# Patient Record
Sex: Female | Born: 1965 | Race: Black or African American | Hispanic: No | Marital: Single | Smoking: Current every day smoker
Health system: Southern US, Community
[De-identification: ages and names within clinical notes are randomized; demographics above are authoritative.]

## PROBLEM LIST (undated history)

## (undated) DIAGNOSIS — E119 Type 2 diabetes mellitus without complications: Secondary | ICD-10-CM

## (undated) DIAGNOSIS — IMO0002 Reserved for concepts with insufficient information to code with codable children: Secondary | ICD-10-CM

## (undated) DIAGNOSIS — D649 Anemia, unspecified: Secondary | ICD-10-CM

## (undated) DIAGNOSIS — Z5189 Encounter for other specified aftercare: Secondary | ICD-10-CM

## (undated) DIAGNOSIS — T7840XA Allergy, unspecified, initial encounter: Secondary | ICD-10-CM

## (undated) DIAGNOSIS — E079 Disorder of thyroid, unspecified: Secondary | ICD-10-CM

## (undated) DIAGNOSIS — L0292 Furuncle, unspecified: Secondary | ICD-10-CM

## (undated) DIAGNOSIS — F1910 Other psychoactive substance abuse, uncomplicated: Secondary | ICD-10-CM

## (undated) DIAGNOSIS — N83209 Unspecified ovarian cyst, unspecified side: Secondary | ICD-10-CM

## (undated) DIAGNOSIS — E049 Nontoxic goiter, unspecified: Secondary | ICD-10-CM

## (undated) DIAGNOSIS — R87619 Unspecified abnormal cytological findings in specimens from cervix uteri: Secondary | ICD-10-CM

## (undated) DIAGNOSIS — N390 Urinary tract infection, site not specified: Secondary | ICD-10-CM

## (undated) DIAGNOSIS — M1990 Unspecified osteoarthritis, unspecified site: Secondary | ICD-10-CM

## (undated) DIAGNOSIS — F419 Anxiety disorder, unspecified: Secondary | ICD-10-CM

## (undated) DIAGNOSIS — N739 Female pelvic inflammatory disease, unspecified: Secondary | ICD-10-CM

## (undated) DIAGNOSIS — I10 Essential (primary) hypertension: Secondary | ICD-10-CM

## (undated) DIAGNOSIS — N200 Calculus of kidney: Secondary | ICD-10-CM

## (undated) DIAGNOSIS — D219 Benign neoplasm of connective and other soft tissue, unspecified: Secondary | ICD-10-CM

## (undated) DIAGNOSIS — A4902 Methicillin resistant Staphylococcus aureus infection, unspecified site: Secondary | ICD-10-CM

## (undated) HISTORY — DX: Other psychoactive substance abuse, uncomplicated: F19.10

## (undated) HISTORY — DX: Anemia, unspecified: D64.9

## (undated) HISTORY — DX: Unspecified osteoarthritis, unspecified site: M19.90

## (undated) HISTORY — DX: Disorder of thyroid, unspecified: E07.9

## (undated) HISTORY — DX: Allergy, unspecified, initial encounter: T78.40XA

## (undated) HISTORY — DX: Essential (primary) hypertension: I10

## (undated) HISTORY — DX: Anxiety disorder, unspecified: F41.9

## (undated) HISTORY — PX: INDUCED ABORTION: SHX677

## (undated) HISTORY — PX: DILATION AND CURETTAGE OF UTERUS: SHX78

## (undated) HISTORY — PX: SALIVARY STONE REMOVAL: SHX5213

## (undated) HISTORY — PX: KNEE ARTHROSCOPY: SUR90

## (undated) HISTORY — DX: Encounter for other specified aftercare: Z51.89

## (undated) HISTORY — PX: COLPOSCOPY: SHX161

## (undated) HISTORY — PX: ABDOMINAL HYSTERECTOMY: SHX81

---

## 2008-09-12 ENCOUNTER — Emergency Department (HOSPITAL_COMMUNITY): Admission: EM | Admit: 2008-09-12 | Discharge: 2008-09-12 | Payer: Self-pay | Admitting: Family Medicine

## 2009-03-27 ENCOUNTER — Emergency Department (HOSPITAL_COMMUNITY): Admission: EM | Admit: 2009-03-27 | Discharge: 2009-03-27 | Payer: Self-pay | Admitting: Family Medicine

## 2009-05-06 ENCOUNTER — Emergency Department (HOSPITAL_COMMUNITY): Admission: EM | Admit: 2009-05-06 | Discharge: 2009-05-06 | Payer: Self-pay | Admitting: Family Medicine

## 2010-03-31 LAB — POCT I-STAT, CHEM 8
BUN: 6 mg/dL (ref 6–23)
Calcium, Ion: 1.18 mmol/L (ref 1.12–1.32)
Chloride: 106 mEq/L (ref 96–112)
Creatinine, Ser: 0.5 mg/dL (ref 0.4–1.2)
Glucose, Bld: 93 mg/dL (ref 70–99)
TCO2: 25 mmol/L (ref 0–100)

## 2010-04-06 LAB — GC/CHLAMYDIA PROBE AMP, GENITAL: GC Probe Amp, Genital: NEGATIVE

## 2010-04-06 LAB — WET PREP, GENITAL
Clue Cells Wet Prep HPF POC: NONE SEEN
Trich, Wet Prep: NONE SEEN

## 2010-04-17 LAB — WET PREP, GENITAL: Yeast Wet Prep HPF POC: NONE SEEN

## 2010-04-17 LAB — POCT URINALYSIS DIP (DEVICE)
Bilirubin Urine: NEGATIVE
Glucose, UA: NEGATIVE mg/dL
Ketones, ur: NEGATIVE mg/dL
Nitrite: NEGATIVE

## 2010-04-17 LAB — GC/CHLAMYDIA PROBE AMP, GENITAL: GC Probe Amp, Genital: NEGATIVE

## 2010-11-23 ENCOUNTER — Encounter (HOSPITAL_COMMUNITY): Payer: Self-pay | Admitting: *Deleted

## 2010-11-23 ENCOUNTER — Inpatient Hospital Stay (HOSPITAL_COMMUNITY): Payer: Self-pay

## 2010-11-23 ENCOUNTER — Inpatient Hospital Stay (HOSPITAL_COMMUNITY)
Admission: AD | Admit: 2010-11-23 | Discharge: 2010-11-23 | Disposition: A | Discharge status: Home / Self Care | Payer: Self-pay | Source: Ambulatory Visit | Attending: Obstetrics & Gynecology | Admitting: Obstetrics & Gynecology

## 2010-11-23 DIAGNOSIS — N938 Other specified abnormal uterine and vaginal bleeding: Secondary | ICD-10-CM | POA: Insufficient documentation

## 2010-11-23 DIAGNOSIS — D259 Leiomyoma of uterus, unspecified: Secondary | ICD-10-CM

## 2010-11-23 DIAGNOSIS — D649 Anemia, unspecified: Secondary | ICD-10-CM

## 2010-11-23 DIAGNOSIS — R109 Unspecified abdominal pain: Secondary | ICD-10-CM | POA: Insufficient documentation

## 2010-11-23 DIAGNOSIS — N949 Unspecified condition associated with female genital organs and menstrual cycle: Secondary | ICD-10-CM | POA: Insufficient documentation

## 2010-11-23 LAB — CBC
MCH: 30.2 pg (ref 26.0–34.0)
Platelets: 184 10*3/uL (ref 150–400)
RBC: 3.08 MIL/uL — ABNORMAL LOW (ref 3.87–5.11)
WBC: 5.6 10*3/uL (ref 4.0–10.5)

## 2010-11-23 LAB — WET PREP, GENITAL: Trich, Wet Prep: NONE SEEN

## 2010-11-23 MED ORDER — KETOROLAC TROMETHAMINE 60 MG/2ML IM SOLN
60.0000 mg | Freq: Once | INTRAMUSCULAR | Status: AC
  Administered 2010-11-23: 60 mg via INTRAMUSCULAR
  Filled 2010-11-23: qty 2

## 2010-11-23 MED ORDER — HYDROCODONE-ACETAMINOPHEN 5-325 MG PO TABS: 1.0000 | ORAL_TABLET | ORAL | Status: AC | PRN

## 2010-11-23 NOTE — ED Provider Notes (Signed)
History     Chief Complaint  Patient presents with  . Abdominal Pain   HPI 45 y.o. [id number] with c/o pain and vaginal bleeding. Has ongoing low abdominal pain. LMP started 5 days ago, states for 2 days she was wearing both a pad and tampon and changing q 30 minutes. States she "passed out" x 3 yesterday. H/O fibroids, ovarian cysts, heavy periods and anemia. Has been admitted in past for blood transfusion. Has Mirena in place to help control bleeding x 3 years - states it hasn't helped at all.      No past medical history on file.  No past surgical history on file.  No family history on file.  History  Substance Use Topics  . Smoking status: Not on file  . Smokeless tobacco: Not on file  . Alcohol Use: Not on file    Allergies:  Allergies  Allergen Reactions  . Shellfish Allergy Anaphylaxis    Prescriptions prior to admission  Medication Sig Dispense Refill  . aspirin 81 MG tablet Take 81 mg by mouth daily.        . cyclobenzaprine (FLEXERIL) 10 MG tablet Take 10 mg by mouth 3 (three) times daily as needed.        . Ferrous Sulfate (IRON) 325 (65 FE) MG TABS Take 1 capsule by mouth daily as needed.        . hydrochlorothiazide (HYDRODIURIL) 25 MG tablet Take 25 mg by mouth daily.          Review of Systems  Constitutional: Negative.   Respiratory: Negative.   Cardiovascular: Negative.   Gastrointestinal: Positive for abdominal pain. Negative for nausea, vomiting, diarrhea and constipation.  Genitourinary: Negative for dysuria, urgency, frequency, hematuria and flank pain.       Negative for vaginal bleeding, vaginal discharge, dyspareunia  Musculoskeletal: Negative.   Neurological: Negative.   Psychiatric/Behavioral: Negative.    Physical Exam   Blood pressure 141/88, pulse 100, temperature 98.6 F (37 C), resp. rate 18, height 5\' 5"  (1.651 m), weight 100.245 kg (221 lb), last menstrual period 11/18/2010, SpO2 99.00%.  Physical Exam  Constitutional: She is  oriented to person, place, and time. She appears well-developed and well-nourished. No distress.  HENT:  Head: Normocephalic and atraumatic.  Cardiovascular: Normal rate, regular rhythm and normal heart sounds.   Respiratory: Effort normal and breath sounds normal. No respiratory distress.  GI: Soft. Bowel sounds are normal. She exhibits no distension and no mass. There is no tenderness. There is no rebound and no guarding.  Genitourinary: There is no rash or lesion on the right labia. There is no rash or lesion on the left labia. Uterus is enlarged and tender. Uterus is not deviated and not fixed. Cervix exhibits motion tenderness. Cervix exhibits no discharge and no friability. Right adnexum displays tenderness. Right adnexum displays no mass and no fullness. Left adnexum displays tenderness. Left adnexum displays no mass and no fullness. There is bleeding (light) around the vagina. No erythema or tenderness around the vagina. No vaginal discharge found.  Neurological: She is alert and oriented to person, place, and time.  Skin: Skin is warm and dry.  Psychiatric: She has a normal mood and affect.    MAU Course  Procedures  Results for orders placed during the hospital encounter of 11/23/10 (from the past 24 hour(s))  CBC     Status: Abnormal   Collection Time   11/23/10  7:30 PM      Component Value Range  WBC 5.6  4.0 - 10.5 (K/uL)   RBC 3.08 (*) 3.87 - 5.11 (MIL/uL)   Hemoglobin 9.3 (*) 12.0 - 15.0 (g/dL)   HCT 91.4 (*) 78.2 - 46.0 (%)   MCV 94.5  78.0 - 100.0 (fL)   MCH 30.2  26.0 - 34.0 (pg)   MCHC 32.0  30.0 - 36.0 (g/dL)   RDW 95.6 (*) 21.3 - 15.5 (%)   Platelets 184  150 - 400 (K/uL)  WET PREP, GENITAL     Status: Abnormal   Collection Time   11/23/10  8:12 PM      Component Value Range   Yeast, Wet Prep NONE SEEN  NONE SEEN    Trich, Wet Prep NONE SEEN  NONE SEEN    Clue Cells, Wet Prep NONE SEEN  NONE SEEN    WBC, Wet Prep HPF POC FEW (*) NONE SEEN      Assessment  and Plan  45 y.o. [id number] with pelvic pain U/S pending Care assumed by [facility], NP  FRAZIER,NATALIE 11/23/2010, 9:00 PM   US Transvaginal Non-ob  11/23/2010  *RADIOLOGY REPORT*  Clinical Data: Pelvic pain with heavy bleeding.  History of fibroids.  TRANSABDOMINAL AND TRANSVAGINAL ULTRASOUND OF PELVIS Technique:  Both transabdominal and transvaginal ultrasound examinations of the pelvis were performed. Transabdominal technique was performed for global imaging of the pelvis including uterus, ovaries, adnexal regions, and pelvic cul-de-sac.  Comparison: None.   It was necessary to proceed with endovaginal exam following the transabdominal exam to visualize the endometrium and ovaries.  Findings:  Uterus: 11.9 x 7.5 x 8.2 cm.  Heterogeneous myometrium compatible with fibroids.  Right fundal fibroid measures 5.9 x 3.7 x 5.1 cm. Left body fibroid measures 2.5 x 2.5 x 2.5 cm.  This may have a submucosal component.  Endometrium: 6.1 mm  Right ovary:  3.1 x 1.6 x 1.7 cm.  No focal abnormality.  Left ovary: 2.8 x 1.6 x 0.7 cm.  No focal abnormality.  Other findings: No free fluid  IMPRESSION: Fibroid uterus.  Normal ovaries.  No free fluid.  Original Report Authenticated By: Camelia Phenes, M.D.   US Pelvis Complete  11/23/2010  *RADIOLOGY REPORT*  Clinical Data: Pelvic pain with heavy bleeding.  History of fibroids.  TRANSABDOMINAL AND TRANSVAGINAL ULTRASOUND OF PELVIS Technique:  Both transabdominal and transvaginal ultrasound examinations of the pelvis were performed. Transabdominal technique was performed for global imaging of the pelvis including uterus, ovaries, adnexal regions, and pelvic cul-de-sac.  Comparison: None.   It was necessary to proceed with endovaginal exam following the transabdominal exam to visualize the endometrium and ovaries.  Findings:  Uterus: 11.9 x 7.5 x 8.2 cm.  Heterogeneous myometrium compatible with fibroids.  Right fundal fibroid measures 5.9 x 3.7 x 5.1 cm. Left body  fibroid measures 2.5 x 2.5 x 2.5 cm.  This may have a submucosal component.  Endometrium: 6.1 mm  Right ovary:  3.1 x 1.6 x 1.7 cm.  No focal abnormality.  Left ovary: 2.8 x 1.6 x 0.7 cm.  No focal abnormality.  Other findings: No free fluid  IMPRESSION: Fibroid uterus.  Normal ovaries.  No free fluid.  Original Report Authenticated By: Camelia Phenes, M.D.   Assessment:  Uterine fibroids   Anemia (patient taking Fe and Hcg is better than last visit)  Plan:  Pain management   Continue Fe   Follow up in GYN Clinic   Return here as needed.   [place], NP 11/24/10 1416

## 2010-11-23 NOTE — Progress Notes (Signed)
Pt c/o vag  bleeding for months.  Did not follow up after hysterectomy recommended years ago.

## 2010-11-23 NOTE — Progress Notes (Signed)
Pt reports a history of fibroids and history of heavy periods. Was hospitalized 3 yrs ago for anemia and received blood transfusion. States she was supposed to have a hysterectomy but did not do that, has IUD that was placed to try and slow bleeding 2 yrs ago. States her last LMP started 5 days ago and 2 of those days she had to use pad and tampon and change them every 30 minutes. Severe pain with this period and has continued now. Hurts in lower abd and lower back. States she "passed out" twice yesterday. The last time her hgb was checked was 2 yrs ago.

## 2010-11-23 NOTE — Progress Notes (Signed)
GOES TO [facility]- DR ARIWODO-  FOR BP MEDS.

## 2010-11-23 NOTE — Progress Notes (Signed)
PT  SAYS SHE HAS FIBROIDS- DX YEARS AGO.    DR IN [place] DX FIBROIDS- IN 2010.  NO DR HERE.    SAYS  2 YEARS AGO- SHE PASSED OUT- HGB WAS 4 - IN [place]- HAD U/S- HAD BLOOD TRANSFUSION.  DR REFUSED TO DO HYST.  REFERRED TO ANOTHER-  DID NOT GO.   THEN MOVED HERE- AND HAS NOT FOLLOWED UP.   SAYS NOW- SOME SPOTTING - LEFT OVER FROM CYCLE.  SAYS HAS LOT OF CLOTS WITH CYCLE.

## 2010-11-24 LAB — GC/CHLAMYDIA PROBE AMP, GENITAL: GC Probe Amp, Genital: NEGATIVE

## 2010-11-26 NOTE — ED Provider Notes (Signed)
Agree with above note.  Laela Deviney H. 11/26/2010 1:12 PM

## 2010-12-11 ENCOUNTER — Encounter: Payer: Self-pay | Admitting: Obstetrics & Gynecology

## 2010-12-11 ENCOUNTER — Ambulatory Visit (INDEPENDENT_AMBULATORY_CARE_PROVIDER_SITE_OTHER): Payer: Self-pay | Admitting: Obstetrics and Gynecology

## 2010-12-11 ENCOUNTER — Other Ambulatory Visit (HOSPITAL_COMMUNITY)
Admission: RE | Admit: 2010-12-11 | Discharge: 2010-12-11 | Disposition: A | Discharge status: Home / Self Care | Payer: Self-pay | Source: Ambulatory Visit | Attending: Obstetrics & Gynecology | Admitting: Obstetrics & Gynecology

## 2010-12-11 VITALS — BP 143/96 | HR 88 | Temp 96.8°F | Ht 66.0 in | Wt 219.5 lb

## 2010-12-11 DIAGNOSIS — Z124 Encounter for screening for malignant neoplasm of cervix: Secondary | ICD-10-CM

## 2010-12-11 DIAGNOSIS — I10 Essential (primary) hypertension: Secondary | ICD-10-CM

## 2010-12-11 DIAGNOSIS — N939 Abnormal uterine and vaginal bleeding, unspecified: Secondary | ICD-10-CM | POA: Insufficient documentation

## 2010-12-11 DIAGNOSIS — D259 Leiomyoma of uterus, unspecified: Secondary | ICD-10-CM | POA: Insufficient documentation

## 2010-12-11 DIAGNOSIS — Z01812 Encounter for preprocedural laboratory examination: Secondary | ICD-10-CM

## 2010-12-11 DIAGNOSIS — Z01419 Encounter for gynecological examination (general) (routine) without abnormal findings: Secondary | ICD-10-CM

## 2010-12-11 DIAGNOSIS — N926 Irregular menstruation, unspecified: Secondary | ICD-10-CM

## 2010-12-11 MED ORDER — MEDROXYPROGESTERONE ACETATE 150 MG/ML IM SUSP
150.0000 mg | INTRAMUSCULAR | Status: DC
  Administered 2010-12-11: 150 mg via INTRAMUSCULAR

## 2010-12-11 NOTE — Patient Instructions (Signed)
Abnormal Uterine Bleeding Abnormal uterine bleeding can have many causes. Some cases are simply treated, while others are more serious. There are several kinds of bleeding that is considered abnormal, including:  Bleeding between periods.     Bleeding after sexual intercourse.     Spotting anytime in the menstrual cycle.     Bleeding heavier or more than normal.     Bleeding after menopause.  CAUSES   There are many causes of abnormal uterine bleeding. It can be present in teenagers, pregnant women, women during their reproductive years, and women who have reached menopause. Your caregiver will look for the more common causes depending on your age, signs, symptoms and your particular circumstance. Most cases are not serious and can be treated. Even the more serious causes, like cancer of the female organs, can be treated adequately if found in the early stages. That is why all types of bleeding should be evaluated and treated as soon as possible. DIAGNOSIS   Diagnosing the cause may take several kinds of tests. Your caregiver may:  Take a complete history of the type of bleeding.     Perform a complete physical exam and Pap smear.     Take an ultrasound on the abdomen showing a picture of the female organs and the pelvis.     Inject dye into the uterus and Fallopian tubes and X-ray them (hysterosalpingogram).     Place fluid in the uterus and do an ultrasound (sonohysterogrqphy).     Take a CT scan to examine the female organs and pelvis.     Take an MRI to examine the female organs and pelvis. There is no X-ray involved with this procedure.     Look inside the uterus with a telescope that has a light at the end (hysteroscopy).     Scrap the inside of the uterus to get tissue to examine (Dilatation and Curettage, D&C).     Look into the pelvis with a telescope that has a light at the end (laparoscopy). This is done through a very small cut (incision) in the abdomen.  TREATMENT     Treatment will depend on the cause of the abnormal bleeding. It can include:  Doing nothing to allow the problem to take care of itself over time.     Hormone treatment.     Birth control pills.     Treating the medical condition causing the problem.     Laparoscopy.    Major or minor surgery     Destroying the lining of the uterus with electrical currant, laser, freezing or heat (uterine ablation).  HOME CARE INSTRUCTIONS    Follow your caregiver's recommendation on how to treat your problem.     See your caregiver if you missed a menstrual period and think you may be pregnant.     If you are bleeding heavily, count the number of pads/tampons you use and how often you have to change them. Tell this to your caregiver.     Avoid sexual intercourse until the problem is controlled.  SEEK MEDICAL CARE IF:    You have any kind of abnormal bleeding mentioned above.     You feel dizzy at times.     You are 45 years old and have not had a menstrual period yet.  SEEK IMMEDIATE MEDICAL CARE IF:    You pass out.     You are changing pads/tampons every 15 to 30 minutes.     You have belly (abdominal)   pain.     You have a temperature of 100 F (37.8 C) or higher.     You become sweaty or weak.     You are passing large blood clots from the vagina.     You start to feel sick to your stomach (nauseous) and throw up (vomit).  Document Released: 12/28/2004 Document Revised: 09/09/2010 Document Reviewed: 05/23/2008 Exit[employer] Patient Information 2012 [employer]. 

## 2010-12-11 NOTE — Progress Notes (Signed)
45 yo Elizabeth Steele with a 4 year h/o abnormal uterine bleeding presenting today requesting surgical management. Patient reports regular monthly menses lasting 5 days but with heavy flow and passage of clots. Patient reports using a tampon and [ad and soaking through her clothes within 30 minutes. Patient has received blood transfusions in the past as a result of her anemia. Patient has been medically managed with Mirena IUD but reports that it has not changed the flow of her menses. Patient currently denies CP, SOB, Lightheadedness or dizziness.  Past Medical History  Diagnosis Date  . Hypertension     takes hctz  . Arthritis   . Thyroid disease     has a goiter, doesn't need medical management  . Anemia    Past Surgical History  Procedure Date  . Knee arthroscopy   . Dilation and curettage of uterus    Family History  Problem Relation Age of Onset  . Diabetes Mother   . Cancer Father     throat  . Cancer Brother     leukemia  . Cancer Daughter     breast  . Diabetes Paternal Aunt   . Thyroid disease Maternal Grandmother    History   Social History  . Marital Status: Single    Spouse Name: N/A    Number of Children: N/A  . Years of Education: N/A   Occupational History  . Not on file.   Social History Main Topics  . Smoking status: Current Everyday Smoker -- 0.2 packs/day for 30 years    Types: Cigarettes  . Smokeless tobacco: Never Used  . Alcohol Use: No  . 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 Sexually Active: Yes    Birth Control/ Protection: Condom, IUD   Other Topics Concern  . Not on file   Social History Narrative  . No narrative on file   Blood pressure 143/96, pulse 88, temperature 96.8 F (36 C), height 5\' 6"  (1.676 m), weight 219 lb 8 oz (99.565 kg), last menstrual period 11/18/2010. GENERAL: Well-developed, well-nourished female in no acute distress.  HEENT: Normocephalic, atraumatic. Sclerae anicteric.  NECK: Supple. Normal thyroid.    LUNGS: Clear to auscultation bilaterally.  HEART: Regular rate and rhythm. BREASTS: Symmetric in size. No palpable masses or lymphadenopathy, skin changes, or nipple drainage. ABDOMEN: Soft, nontender, nondistended. No organomegaly. PELVIC: Normal external female genitalia. Vagina is pink and rugated.  Normal discharge. Normal appearing cervix. Uterus is 12 week size, non mobile.No adnexal mass or tenderness. IUD strings not visualized EXTREMITIES: No cyanosis, clubbing, or edema, 2+ distal pulses.  A/P 45 yo with abnormal uterine bleeding and fibroid uterus  - ENDOMETRIAL BIOPSY     The indications for endometrial biopsy were reviewed.   Risks of the biopsy including cramping, bleeding, infection, uterine perforation, inadequate specimen and need for additional procedures  were discussed. The patient states she understands and agrees to undergo procedure today. Consent was signed. Time out was performed. Urine HCG was negative. A sterile speculum was placed in the patient's vagina and the cervix was prepped with Betadine. A single-toothed tenaculum was placed on the anterior lip of the cervix to stabilize it. The uterine cavity was sounded to a depth of 8 cm using the uterine sound. The 3 mm pipelle was introduced into the endometrial cavity without difficulty, 2 passes were made.  A  moderate amount of tissue was  sent to pathology. The instruments were removed from the patient's  vagina. Minimal bleeding from the cervix was noted. The patient tolerated the procedure well.  Routine post-procedure instructions were given to the patient. The patient will follow up in two weeks to review the results and for further management.  - Patient to receive depo-provera today - RTC in 2 weeks for results and further management - Pap smear and mammogram ordered - patient advised to follow-up with PCP for better BP control in anticipation of surgery. Patient desires hysterectomy

## 2010-12-11 NOTE — Progress Notes (Signed)
Addended by: Lynnell Dike on: 12/11/2010 10:48 AM   Modules accepted: Orders

## 2010-12-17 ENCOUNTER — Ambulatory Visit: Payer: Self-pay | Admitting: Family Medicine

## 2010-12-25 ENCOUNTER — Telehealth: Payer: Self-pay | Admitting: *Deleted

## 2010-12-25 NOTE — Telephone Encounter (Signed)
Pt left message requesting her Bx results. She also has questions about next appt.

## 2010-12-25 NOTE — Telephone Encounter (Signed)
I returned pt call and informed her of normal Pap and Endo Bx results. Pt stated that she has taken time off from school and work and thought that she would be able to have surgery sooner. She states that her quality of life has been affected by the constant pain and hemorrhaging that she was having before receiving the Depo Provera Injection. I asked about pt's bleeding and she states that she has only had spotting since the injection. Pt has been taking ibuprofen 400 mg for pain with minimal relief. I advised pt of the following: we do not have any appts sooner than 01/13/11 for surgical consult; we recommend ibuprofen 600 mg q 6hr prn for pain. I told pt that it may take several doses of medication to have an effect on her pain level and she may need to take it round the clock on some days. Pt voiced understanding.

## 2011-01-12 HISTORY — PX: ABDOMINAL HYSTERECTOMY: SHX81

## 2011-01-13 ENCOUNTER — Inpatient Hospital Stay (HOSPITAL_COMMUNITY)
Admission: AD | Admit: 2011-01-13 | Discharge: 2011-01-13 | Disposition: A | Discharge status: Home / Self Care | Payer: Self-pay | Source: Ambulatory Visit | Attending: Obstetrics and Gynecology | Admitting: Obstetrics and Gynecology

## 2011-01-13 ENCOUNTER — Encounter (HOSPITAL_COMMUNITY): Payer: Self-pay | Admitting: *Deleted

## 2011-01-13 ENCOUNTER — Ambulatory Visit: Payer: Self-pay | Admitting: Obstetrics & Gynecology

## 2011-01-13 DIAGNOSIS — R109 Unspecified abdominal pain: Secondary | ICD-10-CM | POA: Insufficient documentation

## 2011-01-13 DIAGNOSIS — D259 Leiomyoma of uterus, unspecified: Secondary | ICD-10-CM | POA: Insufficient documentation

## 2011-01-13 HISTORY — DX: Reserved for concepts with insufficient information to code with codable children: IMO0002

## 2011-01-13 HISTORY — DX: Unspecified ovarian cyst, unspecified side: N83.209

## 2011-01-13 HISTORY — DX: Female pelvic inflammatory disease, unspecified: N73.9

## 2011-01-13 HISTORY — DX: Methicillin resistant Staphylococcus aureus infection, unspecified site: A49.02

## 2011-01-13 HISTORY — DX: Unspecified abnormal cytological findings in specimens from cervix uteri: R87.619

## 2011-01-13 HISTORY — DX: Nontoxic goiter, unspecified: E04.9

## 2011-01-13 HISTORY — DX: Benign neoplasm of connective and other soft tissue, unspecified: D21.9

## 2011-01-13 HISTORY — DX: Urinary tract infection, site not specified: N39.0

## 2011-01-13 LAB — URINALYSIS, ROUTINE W REFLEX MICROSCOPIC
Glucose, UA: NEGATIVE mg/dL
Ketones, ur: NEGATIVE mg/dL
Leukocytes, UA: NEGATIVE
pH: 6.5 (ref 5.0–8.0)

## 2011-01-13 LAB — POCT PREGNANCY, URINE: Preg Test, Ur: NEGATIVE

## 2011-01-13 MED ORDER — KETOROLAC TROMETHAMINE 60 MG/2ML IM SOLN
60.0000 mg | Freq: Once | INTRAMUSCULAR | Status: AC
  Administered 2011-01-13: 60 mg via INTRAMUSCULAR
  Filled 2011-01-13: qty 2

## 2011-01-13 NOTE — ED Notes (Signed)
Pt had come out, her son is here to pick her up and she is ready to go.

## 2011-01-13 NOTE — ED Provider Notes (Signed)
History     Chief Complaint  Patient presents with  . Abdominal Pain   HPI Elizabeth Steele 46 y.o. client of GYN clinic.  Had an appointment today in the clinic but did not bring $20 copay.  Was told she could not be seen in the clinic without the copay.  Was told if she was in pain, she could be seen in MAU.  Client reports she was unaware that she needed to have a copay.  Wanted to be billed.  States she has been in pain "ever since you all mutilated me inside".  Client has history of endometrial biopsy 12-11-2010.   She thought she was to schedule a hysterectomy at her appointment today.  OB History    Grav Para Term Preterm Abortions TAB SAB Ect Mult Living   5 2 1 1 3 3    1       Past Medical History  Diagnosis Date  . Hypertension     takes hctz  . Arthritis   . Thyroid disease     has a goiter, doesn't need medical management  . Anemia   . Fibroids   . Goiter   . Urinary tract infection   . MRSA (methicillin resistant Staphylococcus aureus)     2005  . Ovarian cyst   . PID (pelvic inflammatory disease)   . Abnormal Pap smear     Past Surgical History  Procedure Date  . Knee arthroscopy   . Dilation and curettage of uterus   . Salivary stone removal     gland removed right  . Induced abortion     x 3  . Colposcopy     Family History  Problem Relation Age of Onset  . Diabetes Mother   . Cancer Father     throat  . Cancer Brother     leukemia  . Cancer Daughter     breast  . Diabetes Paternal Aunt   . Thyroid disease Maternal Grandmother   . Anesthesia problems Neg Hx     History  Substance Use Topics  . Smoking status: Current Everyday Smoker -- 0.2 packs/day for 30 years    Types: Cigarettes  . Smokeless tobacco: Never Used  . Alcohol Use: No    Allergies:  Allergies  Allergen Reactions  . Shellfish Allergy Anaphylaxis    Prescriptions prior to admission  Medication Sig Dispense Refill  . aspirin 81 MG tablet Take 81 mg by mouth daily.         . cyclobenzaprine (FLEXERIL) 10 MG tablet Take 10 mg by mouth 3 (three) times daily as needed.        . Ferrous Sulfate (IRON) 325 (65 FE) MG TABS Take 1 capsule by mouth daily as needed.        . hydrochlorothiazide (HYDRODIURIL) 25 MG tablet Take 25 mg by mouth daily.        Marland Kitchen ibuprofen (ADVIL,MOTRIN) 200 MG tablet Take 200 mg by mouth every 6 (six) hours as needed.        Marland Kitchen levonorgestrel (MIRENA) 20 MCG/24HR IUD 1 each by Intrauterine route once.          ROS Physical Exam   Blood pressure 145/93, pulse 92, temperature 99.2 F (37.3 C), temperature source Oral, resp. rate 20, height 5\' 6"  (1.676 m), weight 225 lb (102.059 kg), last menstrual period 11/18/2010, SpO2 98.00%.  Physical Exam  MAU Course  Procedures Toradol 60 mg IM for pain Consult with Dr. Jolayne Panther re:  plan of care  MDM Client approached provider.  Has a ride to go home and wants to be discharged now.  Has rescheduled appointment in GYN clinic for Friday.  Assessment and Plan  Abdominal pain likely due to fibroids  Plan Ibuprofen 600 mg PO q6h with food for pain Keep appointment in GYN clinic on Friday.  Elizabeth Steele 01/13/2011, 3:08 PM   Nolene Bernheim, NP 01/13/11 1606

## 2011-01-13 NOTE — Progress Notes (Signed)
Here 12/12 for prolonged bleeding,hx of fibroids requiring transfusions.  had f/u 12/30 they did biopsy, no warning,did not sign anything. Now has been in pain ever since.  Was to have pre-op today, would not talk with her or see her because she did not have $20.

## 2011-01-14 ENCOUNTER — Ambulatory Visit: Payer: Self-pay | Admitting: Family Medicine

## 2011-01-15 ENCOUNTER — Ambulatory Visit (INDEPENDENT_AMBULATORY_CARE_PROVIDER_SITE_OTHER): Payer: Self-pay | Admitting: Obstetrics & Gynecology

## 2011-01-15 ENCOUNTER — Encounter (HOSPITAL_COMMUNITY): Payer: Self-pay

## 2011-01-15 ENCOUNTER — Encounter (HOSPITAL_COMMUNITY)
Admission: RE | Admit: 2011-01-15 | Discharge: 2011-01-15 | Disposition: A | Discharge status: Home / Self Care | Payer: Self-pay | Source: Ambulatory Visit | Attending: Obstetrics & Gynecology | Admitting: Obstetrics & Gynecology

## 2011-01-15 ENCOUNTER — Other Ambulatory Visit: Payer: Self-pay

## 2011-01-15 ENCOUNTER — Encounter: Payer: Self-pay | Admitting: Obstetrics & Gynecology

## 2011-01-15 VITALS — BP 148/90 | HR 88 | Temp 98.0°F | Ht 66.0 in | Wt 226.5 lb

## 2011-01-15 DIAGNOSIS — N949 Unspecified condition associated with female genital organs and menstrual cycle: Secondary | ICD-10-CM

## 2011-01-15 DIAGNOSIS — D219 Benign neoplasm of connective and other soft tissue, unspecified: Secondary | ICD-10-CM

## 2011-01-15 DIAGNOSIS — N938 Other specified abnormal uterine and vaginal bleeding: Secondary | ICD-10-CM

## 2011-01-15 DIAGNOSIS — D259 Leiomyoma of uterus, unspecified: Secondary | ICD-10-CM

## 2011-01-15 DIAGNOSIS — D649 Anemia, unspecified: Secondary | ICD-10-CM

## 2011-01-15 LAB — CBC
Hemoglobin: 14.6 g/dL (ref 12.0–15.0)
MCHC: 33.9 g/dL (ref 30.0–36.0)
Platelets: 137 10*3/uL — ABNORMAL LOW (ref 150–400)
RDW: 14 % (ref 11.5–15.5)

## 2011-01-15 LAB — BASIC METABOLIC PANEL
GFR calc Af Amer: >90 mL/min (ref >90)
GFR calc non Af Amer: >90 mL/min (ref >90)
Glucose, Bld: 165 mg/dL — ABNORMAL HIGH (ref 70–99)
Potassium: 3.3 mEq/L — ABNORMAL LOW (ref 3.5–5.1)
Sodium: 138 mEq/L (ref 135–145)

## 2011-01-15 LAB — SURGICAL PCR SCREEN
MRSA, PCR: NEGATIVE
Staphylococcus aureus: NEGATIVE

## 2011-01-15 LAB — TSH: TSH: 0.503 u[IU]/mL (ref 0.350–4.500)

## 2011-01-15 NOTE — Patient Instructions (Addendum)
   Your procedure is scheduled [id number] January 11th  Enter through the Main Entrance of [facility] at:6am Pick up the phone at the desk and dial [phone] and inform us of your arrival.  Please call this number if you have any problems the morning of surgery: [phone]  Remember: Do not eat food after midnight:Thursday Do not drink clear liquids after: midnight Thursday Take these medicines the morning of surgery with a SIP OF WATER: blood pressure medicine  Do not wear jewelry, make-up, or FINGER nail polish Do not wear lotions, powders, perfumes or deodorant. Do not shave 48 hours prior to surgery. Do not bring valuables to the hospital.  Leave suitcase in the car. After Surgery it may be brought to your room. For patients being admitted to the hospital, checkout time is 11:00am the day of discharge.  Patients discharged on the day of surgery will not be allowed to drive home.     Remember to use your hibiclens as instructed.Please shower with 1/2 bottle the evening before your surgery and the other 1/2 bottle the morning of surgery.    Your procedure is scheduled on:  Enter through the Main Entrance of [facility] at: Pick up the phone at the desk and dial [phone] and inform us of your arrival.  Please call this number if you have any problems the morning of surgery: [phone]  Remember: Do not eat food after midnight: Do not drink clear liquids after: Take these medicines the morning of surgery with a SIP OF WATER:  Do not wear jewelry, make-up, or FINGER nail polish Do not wear lotions, powders, perfumes or deodorant. Do not shave 48 hours prior to surgery. Do not bring valuables to the hospital.  Leave suitcase in the car. After Surgery it may be brought to your room. For patients being admitted to the hospital, checkout time is 11:00am the day of discharge.  Patients discharged on the day of surgery will not be allowed to drive home.     Remember to use  your hibiclens as instructed.Please shower with 1/2 bottle the evening before your surgery and the other 1/2 bottle the morning of surgery.

## 2011-01-15 NOTE — Progress Notes (Signed)
  Subjective:    Patient ID: Elizabeth Steele, female    DOB: 10-Sep-1965, 46 y.o.   MRN: [id number]  HPI  Ms. Lasseter is a W AA [id number] who was seen here at the gyn clinic by Dr. Jolayne Panther for DUB. An ultrasound shows an fibroid uterus. Her EMBX was normal as was her pap smear. She had tried the Mirena IUD but still had VB. She is now on the depo provera but says the fibroids are causing her pain also, and she wants a hysterectomy. Her hbg is 9.3. She complains of a year history of occasional GSUI, not every day.  Review of Systems She has been abstinent since Thanksgiving when she ended her relationship with her boyfriend. 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 She would like to return to work ASAP. When she was sexually active, she had dysparunia "sometimes" but was consistently orgasmic.    Objective:   Physical Exam  Moderately obese abdomen Heart-rrr without m,r,g Lungs- CTAB Pelvic exam- 13 week size uterus, mobile, no descensus Negative Q tip test      Assessment & Plan:  Symptomatic fibroids- She wants a hysterectomy and prefers the [facility] after our discussion of the various approaches. She understands the risks of surgery including but not limited to the increased risk of cuff dehiscence. She wishes to be scheduled ASAP.

## 2011-01-16 LAB — GC/CHLAMYDIA PROBE AMP, URINE
Chlamydia, Swab/Urine, PCR: NEGATIVE
GC Probe Amp, Urine: NEGATIVE

## 2011-01-18 NOTE — ED Provider Notes (Signed)
Agree with above note.  Elizabeth Steele 01/18/2011 9:59 AM

## 2011-01-22 ENCOUNTER — Ambulatory Visit (HOSPITAL_COMMUNITY): Payer: Self-pay | Admitting: Anesthesiology

## 2011-01-22 ENCOUNTER — Encounter (HOSPITAL_COMMUNITY): Payer: Self-pay | Admitting: General Practice

## 2011-01-22 ENCOUNTER — Encounter (HOSPITAL_COMMUNITY): Payer: Self-pay | Admitting: Anesthesiology

## 2011-01-22 ENCOUNTER — Encounter (HOSPITAL_COMMUNITY)
Admission: RE | Disposition: A | Discharge status: Home / Self Care | Payer: Self-pay | Source: Ambulatory Visit | Attending: Obstetrics & Gynecology

## 2011-01-22 ENCOUNTER — Encounter (HOSPITAL_COMMUNITY): Payer: Self-pay | Admitting: *Deleted

## 2011-01-22 ENCOUNTER — Other Ambulatory Visit: Payer: Self-pay | Admitting: Obstetrics & Gynecology

## 2011-01-22 ENCOUNTER — Ambulatory Visit (HOSPITAL_COMMUNITY)
Admission: RE | Admit: 2011-01-22 | Discharge: 2011-01-22 | Disposition: A | Discharge status: Home / Self Care | Payer: Self-pay | Source: Ambulatory Visit | Attending: Obstetrics & Gynecology | Admitting: Obstetrics & Gynecology

## 2011-01-22 DIAGNOSIS — N8000 Endometriosis of the uterus, unspecified: Secondary | ICD-10-CM | POA: Insufficient documentation

## 2011-01-22 DIAGNOSIS — D252 Subserosal leiomyoma of uterus: Secondary | ICD-10-CM | POA: Insufficient documentation

## 2011-01-22 DIAGNOSIS — IMO0002 Reserved for concepts with insufficient information to code with codable children: Secondary | ICD-10-CM

## 2011-01-22 DIAGNOSIS — D259 Leiomyoma of uterus, unspecified: Secondary | ICD-10-CM

## 2011-01-22 DIAGNOSIS — N920 Excessive and frequent menstruation with regular cycle: Secondary | ICD-10-CM

## 2011-01-22 DIAGNOSIS — N949 Unspecified condition associated with female genital organs and menstrual cycle: Secondary | ICD-10-CM

## 2011-01-22 DIAGNOSIS — N946 Dysmenorrhea, unspecified: Secondary | ICD-10-CM

## 2011-01-22 DIAGNOSIS — D251 Intramural leiomyoma of uterus: Secondary | ICD-10-CM | POA: Insufficient documentation

## 2011-01-22 DIAGNOSIS — E669 Obesity, unspecified: Secondary | ICD-10-CM | POA: Insufficient documentation

## 2011-01-22 DIAGNOSIS — D649 Anemia, unspecified: Secondary | ICD-10-CM | POA: Insufficient documentation

## 2011-01-22 HISTORY — PX: CYSTOSCOPY: SHX5120

## 2011-01-22 LAB — PREGNANCY, URINE: Preg Test, Ur: NEGATIVE

## 2011-01-22 SURGERY — ROBOTIC ASSISTED TOTAL HYSTERECTOMY
Anesthesia: General | Site: Vagina | Wound class: Clean Contaminated

## 2011-01-22 MED ORDER — PROPOFOL 10 MG/ML IV EMUL
INTRAVENOUS | Status: AC
  Filled 2011-01-22: qty 20

## 2011-01-22 MED ORDER — DEXTROSE IN LACTATED RINGERS 5 % IV SOLN: INTRAVENOUS | Status: DC

## 2011-01-22 MED ORDER — LIDOCAINE HCL (CARDIAC) 20 MG/ML IV SOLN
INTRAVENOUS | Status: DC | PRN
  Administered 2011-01-22: 80 mg via INTRAVENOUS

## 2011-01-22 MED ORDER — PHENYLEPHRINE HCL 10 MG/ML IJ SOLN
INTRAMUSCULAR | Status: DC | PRN
  Administered 2011-01-22: 80 ug via INTRAVENOUS
  Administered 2011-01-22: 120 ug via INTRAVENOUS

## 2011-01-22 MED ORDER — ROCURONIUM BROMIDE 50 MG/5ML IV SOLN
INTRAVENOUS | Status: AC
  Filled 2011-01-22: qty 2

## 2011-01-22 MED ORDER — ONDANSETRON HCL 4 MG/2ML IJ SOLN
INTRAMUSCULAR | Status: DC | PRN
  Administered 2011-01-22: 4 mg via INTRAVENOUS

## 2011-01-22 MED ORDER — STERILE WATER FOR IRRIGATION IR SOLN
Status: DC | PRN
  Administered 2011-01-22: 1000 mL

## 2011-01-22 MED ORDER — KETOROLAC TROMETHAMINE 30 MG/ML IJ SOLN
15.0000 mg | Freq: Once | INTRAMUSCULAR | Status: AC | PRN
  Administered 2011-01-22: 30 mg via INTRAVENOUS

## 2011-01-22 MED ORDER — OXYCODONE-ACETAMINOPHEN 5-325 MG PO TABS: 1.0000 | ORAL_TABLET | ORAL | Status: AC | PRN

## 2011-01-22 MED ORDER — ACETAMINOPHEN 10 MG/ML IV SOLN
1000.0000 mg | Freq: Once | INTRAVENOUS | Status: DC
  Filled 2011-01-22: qty 100

## 2011-01-22 MED ORDER — IBUPROFEN 800 MG PO TABS: 800.0000 mg | ORAL_TABLET | Freq: Three times a day (TID) | ORAL | Status: DC | PRN

## 2011-01-22 MED ORDER — ROCURONIUM BROMIDE 100 MG/10ML IV SOLN
INTRAVENOUS | Status: DC | PRN
  Administered 2011-01-22: 20 mg via INTRAVENOUS
  Administered 2011-01-22: 10 mg via INTRAVENOUS
  Administered 2011-01-22: 40 mg via INTRAVENOUS
  Administered 2011-01-22: 10 mg via INTRAVENOUS

## 2011-01-22 MED ORDER — FENTANYL CITRATE 0.05 MG/ML IJ SOLN
INTRAMUSCULAR | Status: AC
  Filled 2011-01-22: qty 5

## 2011-01-22 MED ORDER — LACTATED RINGERS IV SOLN
INTRAVENOUS | Status: DC | PRN
  Administered 2011-01-22 (×2): via INTRAVENOUS

## 2011-01-22 MED ORDER — MIDAZOLAM HCL 2 MG/2ML IJ SOLN
INTRAMUSCULAR | Status: AC
  Filled 2011-01-22: qty 2

## 2011-01-22 MED ORDER — PROPOFOL 10 MG/ML IV EMUL
INTRAVENOUS | Status: DC | PRN
  Administered 2011-01-22: 200 mg via INTRAVENOUS

## 2011-01-22 MED ORDER — PHENYLEPHRINE 40 MCG/ML (10ML) SYRINGE FOR IV PUSH (FOR BLOOD PRESSURE SUPPORT)
PREFILLED_SYRINGE | INTRAVENOUS | Status: AC
  Filled 2011-01-22: qty 5

## 2011-01-22 MED ORDER — INDIGOTINDISULFONATE SODIUM 8 MG/ML IJ SOLN
INTRAMUSCULAR | Status: DC | PRN
  Administered 2011-01-22: 5 mL via INTRAVENOUS

## 2011-01-22 MED ORDER — HYDROMORPHONE HCL PF 1 MG/ML IJ SOLN
INTRAMUSCULAR | Status: DC | PRN
  Administered 2011-01-22 (×2): 1 mg via INTRAVENOUS

## 2011-01-22 MED ORDER — GLYCOPYRROLATE 0.2 MG/ML IJ SOLN
INTRAMUSCULAR | Status: AC
  Filled 2011-01-22: qty 3

## 2011-01-22 MED ORDER — CEFAZOLIN SODIUM 1-5 GM-% IV SOLN: 1.0000 g | INTRAVENOUS | Status: DC

## 2011-01-22 MED ORDER — FENTANYL CITRATE 0.05 MG/ML IJ SOLN
INTRAMUSCULAR | Status: AC
  Administered 2011-01-22: 50 ug via INTRAVENOUS
  Filled 2011-01-22: qty 2

## 2011-01-22 MED ORDER — CEFAZOLIN SODIUM 1-5 GM-% IV SOLN
INTRAVENOUS | Status: AC
  Administered 2011-01-22: 1 g via INTRAVENOUS
  Filled 2011-01-22: qty 50

## 2011-01-22 MED ORDER — KETOROLAC TROMETHAMINE 30 MG/ML IJ SOLN
INTRAMUSCULAR | Status: AC
  Administered 2011-01-22: 30 mg via INTRAVENOUS
  Filled 2011-01-22: qty 1

## 2011-01-22 MED ORDER — SODIUM CHLORIDE 0.9 % IJ SOLN
INTRAMUSCULAR | Status: DC | PRN
  Administered 2011-01-22: 60 mL

## 2011-01-22 MED ORDER — FENTANYL CITRATE 0.05 MG/ML IJ SOLN
25.0000 ug | INTRAMUSCULAR | Status: DC | PRN
  Administered 2011-01-22 (×2): 50 ug via INTRAVENOUS

## 2011-01-22 MED ORDER — DEXAMETHASONE SODIUM PHOSPHATE 10 MG/ML IJ SOLN
INTRAMUSCULAR | Status: AC
  Filled 2011-01-22: qty 1

## 2011-01-22 MED ORDER — ONDANSETRON HCL 4 MG/2ML IJ SOLN
INTRAMUSCULAR | Status: AC
  Filled 2011-01-22: qty 2

## 2011-01-22 MED ORDER — INDIGOTINDISULFONATE SODIUM 8 MG/ML IJ SOLN
INTRAMUSCULAR | Status: AC
  Filled 2011-01-22: qty 5

## 2011-01-22 MED ORDER — FENTANYL CITRATE 0.05 MG/ML IJ SOLN
INTRAMUSCULAR | Status: DC | PRN
  Administered 2011-01-22: 100 ug via INTRAVENOUS
  Administered 2011-01-22 (×3): 50 ug via INTRAVENOUS

## 2011-01-22 MED ORDER — ACETAMINOPHEN 10 MG/ML IV SOLN
INTRAVENOUS | Status: DC | PRN
  Administered 2011-01-22: 1000 mg via INTRAVENOUS

## 2011-01-22 MED ORDER — ARTIFICIAL TEARS OP OINT
TOPICAL_OINTMENT | OPHTHALMIC | Status: DC | PRN
  Administered 2011-01-22: 1 via OPHTHALMIC

## 2011-01-22 MED ORDER — NEOSTIGMINE METHYLSULFATE 1 MG/ML IJ SOLN
INTRAMUSCULAR | Status: AC
  Filled 2011-01-22: qty 10

## 2011-01-22 MED ORDER — SIMETHICONE 80 MG PO CHEW: 80.0000 mg | CHEWABLE_TABLET | Freq: Four times a day (QID) | ORAL | Status: DC | PRN

## 2011-01-22 MED ORDER — LACTATED RINGERS IV SOLN
INTRAVENOUS | Status: DC
  Administered 2011-01-22: 11:00:00 via INTRAVENOUS

## 2011-01-22 MED ORDER — LIDOCAINE HCL (CARDIAC) 20 MG/ML IV SOLN
INTRAVENOUS | Status: AC
  Filled 2011-01-22: qty 5

## 2011-01-22 MED ORDER — HYDROMORPHONE HCL PF 1 MG/ML IJ SOLN
INTRAMUSCULAR | Status: AC
  Filled 2011-01-22: qty 2

## 2011-01-22 MED ORDER — ROPIVACAINE HCL 5 MG/ML IJ SOLN
INTRAMUSCULAR | Status: DC | PRN
  Administered 2011-01-22: 60 mL

## 2011-01-22 MED ORDER — OXYCODONE-ACETAMINOPHEN 5-325 MG PO TABS
1.0000 | ORAL_TABLET | ORAL | Status: DC | PRN
  Administered 2011-01-22: 2 via ORAL
  Filled 2011-01-22: qty 2

## 2011-01-22 MED ORDER — DEXAMETHASONE SODIUM PHOSPHATE 10 MG/ML IJ SOLN
INTRAMUSCULAR | Status: DC | PRN
  Administered 2011-01-22: 10 mg via INTRAVENOUS

## 2011-01-22 MED ORDER — LACTATED RINGERS IV SOLN
INTRAVENOUS | Status: DC
  Administered 2011-01-22: 07:00:00 via INTRAVENOUS

## 2011-01-22 MED ORDER — SUCCINYLCHOLINE CHLORIDE 20 MG/ML IJ SOLN
INTRAMUSCULAR | Status: AC
  Filled 2011-01-22: qty 10

## 2011-01-22 MED ORDER — LACTATED RINGERS IR SOLN
Status: DC | PRN
  Administered 2011-01-22: 3000 mL

## 2011-01-22 MED ORDER — MIDAZOLAM HCL 5 MG/5ML IJ SOLN
INTRAMUSCULAR | Status: DC | PRN
  Administered 2011-01-22: 2 mg via INTRAVENOUS

## 2011-01-22 SURGICAL SUPPLY — 67 items
BAG URINE DRAINAGE (UROLOGICAL SUPPLIES) ×5 IMPLANT
BARRIER ADHS 3X4 INTERCEED (GAUZE/BANDAGES/DRESSINGS) ×5 IMPLANT
BENZOIN TINCTURE PRP APPL 2/3 (GAUZE/BANDAGES/DRESSINGS) IMPLANT
BLADE LAPAROSCOPIC MORCELL KIT (BLADE) IMPLANT
BLADELESS LONG 8MM (BLADE) ×5 IMPLANT
CABLE HIGH FREQUENCY MONO STRZ (ELECTRODE) ×5 IMPLANT
CATH FOLEY 3WAY  5CC 16FR (CATHETERS) ×1
CATH FOLEY 3WAY 5CC 16FR (CATHETERS) ×4 IMPLANT
CHLORAPREP W/TINT 26ML (MISCELLANEOUS) ×5 IMPLANT
CLOTH BEACON ORANGE TIMEOUT ST (SAFETY) ×5 IMPLANT
CONT PATH 16OZ SNAP LID 3702 (MISCELLANEOUS) IMPLANT
COVER MAYO STAND STRL (DRAPES) ×5 IMPLANT
COVER TABLE BACK 60X90 (DRAPES) ×10 IMPLANT
COVER TIP SHEARS 8 DVNC (MISCELLANEOUS) ×4 IMPLANT
COVER TIP SHEARS 8MM DA VINCI (MISCELLANEOUS) ×1
DECANTER SPIKE VIAL GLASS SM (MISCELLANEOUS) ×5 IMPLANT
DERMABOND ADVANCED (GAUZE/BANDAGES/DRESSINGS) ×1
DERMABOND ADVANCED .7 DNX12 (GAUZE/BANDAGES/DRESSINGS) ×4 IMPLANT
DRAPE HUG U DISPOSABLE (DRAPE) ×5 IMPLANT
DRAPE HYSTEROSCOPY (DRAPE) ×5 IMPLANT
DRAPE LG THREE QUARTER DISP (DRAPES) ×10 IMPLANT
DRAPE MONITOR DA VINCI (DRAPE) IMPLANT
DRAPE WARM FLUID 44X44 (DRAPE) ×5 IMPLANT
ELECT REM PT RETURN 9FT ADLT (ELECTROSURGICAL) ×5
ELECTRODE REM PT RTRN 9FT ADLT (ELECTROSURGICAL) ×4 IMPLANT
EVACUATOR SMOKE 8.L (FILTER) ×5 IMPLANT
GAUZE VASELINE 3X9 (GAUZE/BANDAGES/DRESSINGS) IMPLANT
GLOVE BIO SURGEON STRL SZ 6.5 (GLOVE) ×15 IMPLANT
GLOVE ECLIPSE 6.5 STRL STRAW (GLOVE) ×15 IMPLANT
GOWN STRL REIN XL XLG (GOWN DISPOSABLE) ×30 IMPLANT
KIT ACCESSORY DA VINCI DISP (KITS) ×1
KIT ACCESSORY DVNC DISP (KITS) ×4 IMPLANT
KIT DISP ACCESSORY 4 ARM (KITS) IMPLANT
MANIPULATOR UTERINE 4.5 ZUMI (MISCELLANEOUS) IMPLANT
NEEDLE INSUFFLATION 14GA 120MM (NEEDLE) ×5 IMPLANT
NEEDLE SPNL 18GX3.5 QUINCKE PK (NEEDLE) IMPLANT
NS IRRIG 1000ML POUR BTL (IV SOLUTION) ×15 IMPLANT
OCCLUDER COLPOPNEUMO (BALLOONS) ×5 IMPLANT
PACK LAVH (CUSTOM PROCEDURE TRAY) ×5 IMPLANT
PAD PREP 24X48 CUFFED NSTRL (MISCELLANEOUS) ×10 IMPLANT
PEN SKIN MARKING STD PT W/RULR (MISCELLANEOUS) ×5 IMPLANT
PLUG CATH AND CAP STER (CATHETERS) ×5 IMPLANT
POSITIONER SURGICAL ARM (MISCELLANEOUS) ×10 IMPLANT
SCRUB PCMX 4 OZ (MISCELLANEOUS) ×10 IMPLANT
SET IRRIG TUBING LAPAROSCOPIC (IRRIGATION / IRRIGATOR) ×10 IMPLANT
SOLUTION ELECTROLUBE (MISCELLANEOUS) ×5 IMPLANT
SPONGE LAP 18X18 X RAY DECT (DISPOSABLE) IMPLANT
STRIP CLOSURE SKIN 1/2X4 (GAUZE/BANDAGES/DRESSINGS) ×5 IMPLANT
SUT VIC AB 0 CT1 27 (SUTURE) ×8
SUT VIC AB 0 CT1 27XBRD ANTBC (SUTURE) ×32 IMPLANT
SUT VIC AB 2-0 CT2 27 (SUTURE) IMPLANT
SUT VICRYL 0 UR6 27IN ABS (SUTURE) ×10 IMPLANT
SUT VICRYL RAPIDE 4/0 PS 2 (SUTURE) ×10 IMPLANT
SYR 50ML LL SCALE MARK (SYRINGE) ×5 IMPLANT
SYSTEM CONVERTIBLE TROCAR (TROCAR) IMPLANT
TAPE STRIPS DRAPE STRL (GAUZE/BANDAGES/DRESSINGS) ×20 IMPLANT
TIP UTERINE 5.1X6CM LAV DISP (MISCELLANEOUS) IMPLANT
TIP UTERINE 6.7X10CM GRN DISP (MISCELLANEOUS) IMPLANT
TIP UTERINE 6.7X6CM WHT DISP (MISCELLANEOUS) IMPLANT
TIP UTERINE 6.7X8CM BLUE DISP (MISCELLANEOUS) ×5 IMPLANT
TOWEL OR 17X24 6PK STRL BLUE (TOWEL DISPOSABLE) ×10 IMPLANT
TROCAR DISP BLADELESS 8 DVNC (TROCAR) ×4 IMPLANT
TROCAR DISP BLADELESS 8MM (TROCAR) ×1
TROCAR XCEL 12X100 BLDLESS (ENDOMECHANICALS) ×5 IMPLANT
TROCAR Z-THREAD 12X150 (TROCAR) ×5 IMPLANT
TROCAR Z-THREAD BLADED 12X100M (TROCAR) IMPLANT
TUBING FILTER THERMOFLATOR (ELECTROSURGICAL) ×5 IMPLANT

## 2011-01-22 NOTE — Transfer of Care (Signed)
Immediate Anesthesia Transfer of Care Note  Patient: Elizabeth Steele  Procedure(s) Performed:  ROBOTIC ASSISTED TOTAL HYSTERECTOMY - morcellator gynecare; CYSTOSCOPY; LAPAROSCOPIC BILATERAL SALPINGO OOPHERECTOMY - With Anterior Colpotomy  Patient Location: PACU  Anesthesia Type: General  Level of Consciousness: awake, alert  and oriented  Airway & Oxygen Therapy: Patient Spontanous Breathing and Patient connected to nasal cannula oxygen  Post-op Assessment: Report given to PACU RN and Post -op Vital signs reviewed and stable  Post vital signs: Reviewed and stable Filed Vitals:   01/22/11 0651  BP: 148/91  Pulse: 97  Temp: 37 C  Resp: 18    Complications: No apparent anesthesia complications

## 2011-01-22 NOTE — Progress Notes (Signed)
DC IV discharge instructions reviewed. Pt states complete understanding. Discharged in wheelchair to main entrance

## 2011-01-22 NOTE — Anesthesia Postprocedure Evaluation (Signed)
  Anesthesia Post-op Note  Patient: Elizabeth Steele  Procedure(s) Performed:  ROBOTIC ASSISTED TOTAL HYSTERECTOMY - morcellator gynecare; CYSTOSCOPY; LAPAROSCOPIC BILATERAL SALPINGO OOPHERECTOMY - With Anterior Colpotomy  Patient Location: PACU  Anesthesia Type: General  Level of Consciousness: awake, alert  and oriented  Airway and Oxygen Therapy: Patient Spontanous Breathing  Post-op Pain: mild  Post-op Assessment: Post-op Vital signs reviewed, Patient's Cardiovascular Status Stable, Respiratory Function Stable, Patent Airway, No signs of Nausea or vomiting and Pain level controlled  Post-op Vital Signs: Reviewed and stable  Complications: No apparent anesthesia complications

## 2011-01-22 NOTE — H&P (Signed)
Elizabeth Steele is an 46 y.o. female.She has a long history of heavy periods, severe dysmenorrhea "like labor" and occasional dysparunia. She would like a hysterectomy. She tried a [employer] fast streamer, but it just "made me bleed harder."  Pertinent Gynecological History: Menses: flow is excessive with use of 20 pads or tampons on heaviest days, used pads and tampons at the same time, spends $30 per month on pads Bleeding: heavy with clots Contraception: abstinence DES exposure: denies Blood transfusions: 2 transfusions in the past Sexually transmitted diseases: no past history and past history: chlamydia as a teenager Previous GYN Procedures: DNC  Last mammogram: normal Date: 2010 Last pap: normal Date: 2012 OB History: G5, P2A3   Menstrual History: Menarche age: 20 Patient's last menstrual period was 11/18/2010.    Past Medical History  Diagnosis Date  . Hypertension     takes hctz  . Arthritis   . Thyroid disease     has a goiter, doesn't need medical management  . Anemia   . Fibroids   . Goiter   . Urinary tract infection   . MRSA (methicillin resistant Staphylococcus aureus)     2005  . Ovarian cyst   . PID (pelvic inflammatory disease)   . Abnormal Pap smear     Past Surgical History  Procedure Date  . Knee arthroscopy   . Dilation and curettage of uterus   . Salivary stone removal     gland removed right  . Induced abortion     x 3  . Colposcopy     Family History  Problem Relation Age of Onset  . Diabetes Mother   . Cancer Father     throat  . Cancer Brother     leukemia  . Cancer Daughter     breast  . Diabetes Paternal Aunt   . Thyroid disease Maternal Grandmother   . Anesthesia problems Neg Hx     Social History:  reports that she has been smoking Cigarettes.  She has a 7.5 pack-year smoking history. She has never used smokeless tobacco. She reports that she does not drink alcohol or use illicit drugs.  Allergies:  Allergies  Allergen Reactions  .  Shellfish Allergy Anaphylaxis    Prescriptions prior to admission  Medication Sig Dispense Refill  . amLODipine (NORVASC) 5 MG tablet Take 5 mg by mouth daily.      Marland Kitchen aspirin 81 MG tablet Take 81 mg by mouth 3 (three) times a week.       . Ferrous Sulfate (IRON) 325 (65 FE) MG TABS Take 1 capsule by mouth daily.       . hydrochlorothiazide (HYDRODIURIL) 25 MG tablet Take 25 mg by mouth daily.         ROS She has had hot flashes for 2 years Blood pressure 148/91, pulse 97, temperature 98.6 F (37 C), temperature source Oral, resp. rate 18, last menstrual period 11/18/2010, SpO2 98.00%. Physical Exam Heart- rrr Lungs- CTAB  Results for orders placed during the hospital encounter of 01/22/11 (from the past 24 hour(s))  PREGNANCY, URINE     Status: Normal   Collection Time   01/22/11  6:00 AM      Component Value Range   Preg Test, Ur NEGATIVE      No results found.  Assessment/Plan: Symptomatic fibroids-plan for robotic approach for hysterectomy, BL salpingectomy, cystoscopy. She understands risks of surgery. After a long discussion, she does want me to remove her ovaries.  Reubin Bushnell C. 01/22/2011, 7:10 AM

## 2011-01-22 NOTE — Op Note (Signed)
01/22/2011  10:28 AM  PATIENT:  Elizabeth Steele  46 y.o. female  PRE-OPERATIVE DIAGNOSIS:  pelvic pain;fibroids;dysfunctional uterine bleeding;anemia,obesity  POST-OPERATIVE DIAGNOSIS:  pelvic pain; fibroids;dysfunctional uterine bleeding; anemia,obesity  PROCEDURE:  Procedure(s): ROBOTIC ASSISTED TOTAL HYSTERECTOMY CYSTOSCOPY LAPAROSCOPIC BILATERAL SALPINGO OOPHERECTOMY  SURGEON:  Nicholaus Bloom, MD  PHYSICIAN ASSISTANT:   ASSISTANTS: Scheryl Darter, MD   ANESTHESIA:   general  EBL:  Total I/O In: -  Out: 440 [Urine:140; Blood:300]  BLOOD ADMINISTERED:none  DRAINS: none   LOCAL MEDICATIONS USED:  OTHER 60 mL ropivicaine  SPECIMEN:  Uterus, tubes, ovaries  DISPOSITION OF SPECIMEN:  PATHOLOGY  COUNTS:  YES  TOURNIQUET:  * No tourniquets in log *  DICTATION: .Dragon Dictation  PLAN OF CARE: outpatient with extended recovery  PATIENT DISPOSITION:  PACU - hemodynamically stable.   Delay start of Pharmacological VTE agent (>24hrs) due to surgical blood loss or risk of bleeding:  no  The risks, benefits, and alternatives of surgery were explained, understood, accepted. All questions were answered and consents were signed. I specifically told her that robotic approach has an increased incidence of vaginal cuff dehiscence.I also coated her normal risks associated with the surgery including, but not limited to, infection, bleeding, damage to bowel, bladder, ureters. In the operating room she was placed in the dorsal lithotomy position and general anesthesia was applied without complication. Her abdomen and vagina were prepped and draped in the usual sterile fashion after extensive careful tucking measures were performed. A roomy uterine manipulator was placed. Please note that the uterus sounded to 9 cm. The cervix was dilated to accommodate a roomy retractor. It was sewn into place at the cervix. I did a paravaginal cuff block with the blue ropivacaine (10 cc). I placed a Foley  catheter, and it drained clear urine throughout the case. Please note that prior to starting the case we did a timeout and I did a bimanual exam which revealed a 12 week size mobile uterus and nonenlarged adnexa. Gloves were changed and I turned my attention to the abdomen. A vertical supraumbilical incision was made and a varies needle was placed intraperitoneally. Low-flow CO2 was used to insufflate the abdomen to approximately 5-1/2 L. Patient abdominal pressure was always less than 15 laparoscopy confirmed correct placement. She was placed in steep Trendelenburg position. The pelvis was inspected the uterus was enlarged consistent with a fibroid seen on ultrasound. Her adnexa appeared normal there were no adhesions or evidence of endometriosis. Her ovaries appeared moderately atrophic.we then placed a 12 mm system port in the right lower quadrant was, to 8 mm ports approximately 12 cm lateral to the umbilicus. Prior to making the incisions each incision site was injected with 10 mL of dilute ropivacaine. The ports were placed in the robot was docked. I then proceeded to the robotic console. The ureters position were were noted bilaterally throughout the case. The infundibulopelvic ligaments were identified bilaterally and cauterized using the PK device. The round ligaments were also identified and ligated with the PK device. A bladder flap was created anteriorly, taking care to avoid damage to the bladder. An anterior colpotomy was made. This incision was carried around circumferentially please note that prior to the colpotomy incision I ligated the uterine vessels bilaterally. With the colpotomy incision on the right-hand side, near the uterine vessels, there was a moderate amount of bleeding from the right uterine vessels. It was somewhat lateral and I did not feel comfortable using the cautery. I therefore used a 0 Vicryl  to do a figure-of-eight suture at the site were then removed the uterus through the  vagina. The remainder of the vaginal cuff was closed with a total of 5-0 Vicryl figure-of-eight sutures. Excellent hemostasis was noted at all pedicles and the vaginal cuff. Both ureters were noted to be functioning normally and of normal caliber. 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 We then closed the 12 mm system port site with a Endoloop closure the robot was unblocked and all ports were removed the fascia at the supraumbilical camera port incision was closed with 0 Vicryl and the subcutaneous tissue at all sites was closed with 4-0 Vicryl suture the instrument sponge and needle counts were correct. She tolerated the procedure well. She was extubated and taken to recovery in stable condition.

## 2011-01-22 NOTE — Addendum Note (Signed)
Addendum  created 01/22/11 1655 by Rosalia Hammers   Modules edited:Notes Section

## 2011-01-22 NOTE — Anesthesia Procedure Notes (Signed)
Procedure Name: Intubation Date/Time: 01/22/2011 7:46 AM Performed by: Karleen Dolphin Pre-anesthesia Checklist: Patient being monitored, Patient identified, Emergency Drugs available, Timeout performed and Suction available Patient Re-evaluated:Patient Re-evaluated prior to inductionOxygen Delivery Method: Circle System Utilized Preoxygenation: Pre-oxygenation with 100% oxygen Intubation Type: IV induction Ventilation: Mask ventilation without difficulty Laryngoscope Size: 3 Tube size: 7.0 mm Number of attempts: 1 Airway Equipment and Method: stylet and video-laryngoscopy Placement Confirmation: ETT inserted through vocal cords under direct vision,  positive ETCO2 and breath sounds checked- equal and bilateral Secured at: 21 cm Tube secured with: Tape Dental Injury: Teeth and Oropharynx as per pre-operative assessment  Difficulty Due To: Difficulty was anticipated and Difficult Airway- due to large tongue

## 2011-01-22 NOTE — Anesthesia Postprocedure Evaluation (Signed)
  Anesthesia Post-op Note  Patient: Elizabeth Steele  Procedure(s) Performed:  ROBOTIC ASSISTED TOTAL HYSTERECTOMY - morcellator gynecare; CYSTOSCOPY; LAPAROSCOPIC BILATERAL SALPINGO OOPHERECTOMY - With Anterior Colpotomy  Patient Location: PACU and Women's Unit  Anesthesia Type: General  Level of Consciousness: awake, alert , oriented and patient cooperative  Airway and Oxygen Therapy: Patient Spontanous Breathing  Post-op Pain: mild  Post-op Assessment: Post-op Vital signs reviewed, Patient's Cardiovascular Status Stable, Respiratory Function Stable, No signs of Nausea or vomiting and Pain level controlled  Post-op Vital Signs: Reviewed and stable  Complications: No apparent anesthesia complications

## 2011-01-22 NOTE — Progress Notes (Signed)
Gave pt pain medicine prior to discharge. Pt pain level being controlled. Pt states she is not operating anything and is going straight home and is ready to be discharged now. Without being monitored.

## 2011-01-22 NOTE — Anesthesia Preprocedure Evaluation (Signed)
Anesthesia Evaluation  Patient identified by MRN, date of birth, ID band Patient awake    Reviewed: Allergy & Precautions, H&P , NPO status , Patient's Chart, lab work & pertinent test results, reviewed documented beta blocker date and time   History of Anesthesia Complications Negative for: history of anesthetic complications  Airway Mallampati: III TM Distance: >3 FB Neck ROM: full    Dental  (+) Teeth Intact and Missing   Pulmonary Current Smoker,  clear to auscultation  Pulmonary exam normal       Cardiovascular Exercise Tolerance: Good hypertension, regular Normal    Neuro/Psych sciatica Negative Neurological ROS  Negative Psych ROS   GI/Hepatic negative GI ROS, Neg liver ROS,   Endo/Other  Morbid obesityEuthyroid goiter  Renal/GU negative Renal ROS  Genitourinary negative   Musculoskeletal   Abdominal   Peds  Hematology negative hematology ROS (+)   Anesthesia Other Findings   Reproductive/Obstetrics negative OB ROS                           Anesthesia Physical Anesthesia Plan  ASA: III  Anesthesia Plan: General ETT   Post-op Pain Management:    Induction:   Airway Management Planned:   Additional Equipment:   Intra-op Plan:   Post-operative Plan:   Informed Consent: I have reviewed the patients History and Physical, chart, labs and discussed the procedure including the risks, benefits and alternatives for the proposed anesthesia with the patient or authorized representative who has indicated his/her understanding and acceptance.   Dental Advisory Given  Plan Discussed with: CRNA and Surgeon  Anesthesia Plan Comments:         Anesthesia Quick Evaluation

## 2011-01-25 ENCOUNTER — Encounter (HOSPITAL_COMMUNITY): Payer: Self-pay | Admitting: Obstetrics & Gynecology

## 2011-03-12 ENCOUNTER — Encounter: Payer: Self-pay | Admitting: Obstetrics & Gynecology

## 2011-03-12 ENCOUNTER — Ambulatory Visit (INDEPENDENT_AMBULATORY_CARE_PROVIDER_SITE_OTHER): Payer: Medicaid Other | Admitting: Obstetrics & Gynecology

## 2011-03-12 VITALS — BP 143/98 | HR 100 | Temp 98.9°F | Ht 65.0 in | Wt 225.4 lb

## 2011-03-12 DIAGNOSIS — Z09 Encounter for follow-up examination after completed treatment for conditions other than malignant neoplasm: Secondary | ICD-10-CM

## 2011-03-12 NOTE — Progress Notes (Signed)
  Subjective:    Patient ID: DANAY MCKELLAR, female    DOB: [birth year], 46 y.o.   MRN: [id number]  HPI  Ms. Lengel is now 6 weeks post op status post RATH/BSO. She is absolutely happy. Her bowel and bladder function is better than pre op and her hot flashes are unchanged and definitely livable. She has not had sex yet.  Review of Systems Her mammogram is due. Her pathol showed adenomyosis and fibroids.    Objective:   Physical Exam Incisions- well healed Cuff- well healed Bimanual reveals no masses or tenderness       Assessment & Plan:  Post op doing well Schedule mammogram RTC 1 year for annual

## 2011-04-02 ENCOUNTER — Ambulatory Visit (HOSPITAL_COMMUNITY): Payer: Medicaid Other

## 2011-04-05 ENCOUNTER — Ambulatory Visit (HOSPITAL_COMMUNITY): Payer: Medicaid Other | Attending: Obstetrics & Gynecology

## 2012-05-31 ENCOUNTER — Encounter (HOSPITAL_COMMUNITY): Payer: Self-pay | Admitting: *Deleted

## 2012-05-31 ENCOUNTER — Inpatient Hospital Stay (HOSPITAL_COMMUNITY)
Admission: AD | Admit: 2012-05-31 | Discharge: 2012-05-31 | Disposition: A | Discharge status: Home / Self Care | Payer: Self-pay | Source: Ambulatory Visit | Attending: Obstetrics and Gynecology | Admitting: Obstetrics and Gynecology

## 2012-05-31 DIAGNOSIS — A499 Bacterial infection, unspecified: Secondary | ICD-10-CM | POA: Insufficient documentation

## 2012-05-31 DIAGNOSIS — N760 Acute vaginitis: Secondary | ICD-10-CM | POA: Insufficient documentation

## 2012-05-31 DIAGNOSIS — N949 Unspecified condition associated with female genital organs and menstrual cycle: Secondary | ICD-10-CM | POA: Insufficient documentation

## 2012-05-31 DIAGNOSIS — B9689 Other specified bacterial agents as the cause of diseases classified elsewhere: Secondary | ICD-10-CM

## 2012-05-31 LAB — URINALYSIS, ROUTINE W REFLEX MICROSCOPIC
Ketones, ur: 15 mg/dL — AB
Leukocytes, UA: NEGATIVE
Nitrite: NEGATIVE
Protein, ur: NEGATIVE mg/dL
Urobilinogen, UA: 0.2 mg/dL (ref 0.0–1.0)

## 2012-05-31 LAB — WET PREP, GENITAL: Trich, Wet Prep: NONE SEEN

## 2012-05-31 MED ORDER — METRONIDAZOLE 500 MG PO TABS: 500.0000 mg | ORAL_TABLET | Freq: Two times a day (BID) | ORAL | Status: DC

## 2012-05-31 MED ORDER — CIPROFLOXACIN HCL 500 MG PO TABS: 500.0000 mg | ORAL_TABLET | Freq: Two times a day (BID) | ORAL | Status: DC

## 2012-05-31 MED ORDER — CLINDAMYCIN HCL 300 MG PO CAPS: 300.0000 mg | ORAL_CAPSULE | Freq: Two times a day (BID) | ORAL | Status: DC

## 2012-05-31 NOTE — MAU Provider Note (Signed)
History     CSN: [id number]  Arrival date and time: 05/31/12 2021   None     Chief Complaint  Patient presents with  . Urinary Tract Infection   HPI  Elizabeth Steele is a 47 y.o. who presents today with pressure when urinating and foul smelling vaginal discharge as well as vulvar irritation.    Past Medical History  Diagnosis Date  . Hypertension     takes hctz  . Arthritis   . Thyroid disease     has a goiter, doesn't need medical management  . Anemia   . Fibroids   . Goiter   . Urinary tract infection   . MRSA (methicillin resistant Staphylococcus aureus)     2005  . Ovarian cyst   . PID (pelvic inflammatory disease)   . Abnormal Pap smear     Past Surgical History  Procedure Laterality Date  . Knee arthroscopy    . Dilation and curettage of uterus    . Salivary stone removal      gland removed right  . Induced abortion      x 3  . Colposcopy    . Cystoscopy  01/22/2011    Procedure: CYSTOSCOPY;  Surgeon: Myra C. Marice Potter, MD;  Location: [facility];  Service: Gynecology;  Laterality: N/A;    Family History  Problem Relation Age of Onset  . Diabetes Mother   . Cancer Father     throat  . Cancer Brother     leukemia  . Cancer Daughter     breast  . Diabetes Paternal Aunt   . Thyroid disease Maternal Grandmother   . Anesthesia problems Neg Hx     History  Substance Use Topics  . Smoking status: Current Every Day Smoker -- 0.25 packs/day for 30 years    Types: Cigarettes  . Smokeless tobacco: Never Used  . Alcohol Use: No    Allergies:  Allergies  Allergen Reactions  . Shellfish Allergy Anaphylaxis    Prescriptions prior to admission  Medication Sig Dispense Refill  . amLODipine (NORVASC) 5 MG tablet Take 5 mg by mouth daily.      Marland Kitchen aspirin 81 MG tablet Take 81 mg by mouth 3 (three) times a week.       . Ferrous Sulfate (IRON) 325 (65 FE) MG TABS Take 1 capsule by mouth daily.       . hydrochlorothiazide (HYDRODIURIL) 25 MG tablet Take 25 mg by  mouth daily.         ROS Physical Exam   Blood pressure 130/98, pulse 97, temperature 98.3 F (36.8 C), temperature source Oral, last menstrual period 11/18/2010.  Physical Exam  Nursing note and vitals reviewed. Constitutional: She is oriented to person, place, and time. She appears well-developed and well-nourished. No distress.  Cardiovascular: Normal rate.   Respiratory: Effort normal.  GI: Soft.  Genitourinary:   External: no lesion Vagina: small amount of white discharge  Neurological: She is alert and oriented to person, place, and time.  Skin: Skin is warm and dry.  Psychiatric: She has a normal mood and affect.    MAU Course  Procedures  Results for orders placed during the hospital encounter of 05/31/12 (from the past 24 hour(s))  URINALYSIS, ROUTINE W REFLEX MICROSCOPIC     Status: Abnormal   Collection Time    05/31/12  8:55 PM      Result Value Range   Color, Urine YELLOW  YELLOW   APPearance HAZY (*) CLEAR  Specific Gravity, Urine 1.020  1.005 - 1.030   pH 6.0  5.0 - 8.0   Glucose, UA >1000 (*) NEGATIVE mg/dL   Hgb urine dipstick NEGATIVE  NEGATIVE   Bilirubin Urine NEGATIVE  NEGATIVE   Ketones, ur 15 (*) NEGATIVE mg/dL   Protein, ur NEGATIVE  NEGATIVE mg/dL   Urobilinogen, UA 0.2  0.0 - 1.0 mg/dL   Nitrite NEGATIVE  NEGATIVE   Leukocytes, UA NEGATIVE  NEGATIVE  URINE MICROSCOPIC-ADD ON     Status: None   Collection Time    05/31/12  8:55 PM      Result Value Range   Squamous Epithelial / LPF RARE  RARE   WBC, UA 0-2  <3 WBC/hpf   RBC / HPF 0-2  <3 RBC/hpf   Bacteria, UA RARE  RARE  GLUCOSE, CAPILLARY     Status: Abnormal   Collection Time    05/31/12  9:43 PM      Result Value Range   Glucose-Capillary 347 (*) 70 - 99 mg/dL   Comment 1 Notify RN    WET PREP, GENITAL     Status: Abnormal   Collection Time    05/31/12  9:50 PM      Result Value Range   Yeast Wet Prep HPF POC NONE SEEN  NONE SEEN   Trich, Wet Prep NONE SEEN  NONE SEEN    Clue Cells Wet Prep HPF POC FEW (*) NONE SEEN   WBC, Wet Prep HPF POC FEW (*) NONE SEEN   Pt states that she ate a candy bar while in the waiting room. Will have her FU with her PCP to discuss further diabetes testing.   Assessment and Plan   1. BV (bacterial vaginosis)    RX: Clindamycin 300mg  PO BID X 7 days Cipro 500mg  BID X 5 days FU with PCP to discuss diabetes testing  Tawnya Crook 05/31/2012, 10:29 PM

## 2012-05-31 NOTE — MAU Note (Signed)
Pt presents with complaints of pain and pressure when urinating. Pt states that she has had these symptoms for about a week and has been treating the symptoms with cranberry juice and it hasn't work.

## 2012-06-01 ENCOUNTER — Emergency Department (HOSPITAL_COMMUNITY)
Admission: EM | Admit: 2012-06-01 | Discharge: 2012-06-01 | Disposition: A | Discharge status: Home / Self Care | Payer: Self-pay | Attending: Emergency Medicine | Admitting: Emergency Medicine

## 2012-06-01 ENCOUNTER — Encounter (HOSPITAL_COMMUNITY): Payer: Self-pay | Admitting: Emergency Medicine

## 2012-06-01 ENCOUNTER — Emergency Department (HOSPITAL_COMMUNITY): Payer: Self-pay

## 2012-06-01 DIAGNOSIS — E119 Type 2 diabetes mellitus without complications: Secondary | ICD-10-CM

## 2012-06-01 DIAGNOSIS — D649 Anemia, unspecified: Secondary | ICD-10-CM | POA: Insufficient documentation

## 2012-06-01 DIAGNOSIS — F17200 Nicotine dependence, unspecified, uncomplicated: Secondary | ICD-10-CM | POA: Insufficient documentation

## 2012-06-01 DIAGNOSIS — I10 Essential (primary) hypertension: Secondary | ICD-10-CM | POA: Insufficient documentation

## 2012-06-01 DIAGNOSIS — E079 Disorder of thyroid, unspecified: Secondary | ICD-10-CM | POA: Insufficient documentation

## 2012-06-01 DIAGNOSIS — R42 Dizziness and giddiness: Secondary | ICD-10-CM

## 2012-06-01 DIAGNOSIS — N760 Acute vaginitis: Secondary | ICD-10-CM | POA: Insufficient documentation

## 2012-06-01 DIAGNOSIS — Z79899 Other long term (current) drug therapy: Secondary | ICD-10-CM | POA: Insufficient documentation

## 2012-06-01 DIAGNOSIS — Z8614 Personal history of Methicillin resistant Staphylococcus aureus infection: Secondary | ICD-10-CM | POA: Insufficient documentation

## 2012-06-01 DIAGNOSIS — Z87440 Personal history of urinary (tract) infections: Secondary | ICD-10-CM | POA: Insufficient documentation

## 2012-06-01 DIAGNOSIS — Z8639 Personal history of other endocrine, nutritional and metabolic disease: Secondary | ICD-10-CM | POA: Insufficient documentation

## 2012-06-01 DIAGNOSIS — H532 Diplopia: Secondary | ICD-10-CM | POA: Insufficient documentation

## 2012-06-01 DIAGNOSIS — Z862 Personal history of diseases of the blood and blood-forming organs and certain disorders involving the immune mechanism: Secondary | ICD-10-CM | POA: Insufficient documentation

## 2012-06-01 DIAGNOSIS — R51 Headache: Secondary | ICD-10-CM | POA: Insufficient documentation

## 2012-06-01 DIAGNOSIS — M129 Arthropathy, unspecified: Secondary | ICD-10-CM | POA: Insufficient documentation

## 2012-06-01 DIAGNOSIS — H538 Other visual disturbances: Secondary | ICD-10-CM | POA: Insufficient documentation

## 2012-06-01 LAB — COMPREHENSIVE METABOLIC PANEL
ALT: 16 U/L (ref 0–35)
AST: 20 U/L (ref 0–37)
Alkaline Phosphatase: 144 U/L — ABNORMAL HIGH (ref 39–117)
CO2: 24 mEq/L (ref 19–32)
Calcium: 9.6 mg/dL (ref 8.4–10.5)
Chloride: 101 mEq/L (ref 96–112)
GFR calc Af Amer: >90 mL/min (ref >90)
GFR calc non Af Amer: >90 mL/min (ref >90)
Glucose, Bld: 258 mg/dL — ABNORMAL HIGH (ref 70–99)
Sodium: 139 mEq/L (ref 135–145)
Total Bilirubin: 0.3 mg/dL (ref 0.3–1.2)

## 2012-06-01 LAB — CBC WITH DIFFERENTIAL/PLATELET
Basophils Absolute: 0 10*3/uL (ref 0.0–0.1)
Eosinophils Relative: 1 % (ref 0–5)
HCT: 46.2 % — ABNORMAL HIGH (ref 36.0–46.0)
Lymphocytes Relative: 29 % (ref 12–46)
Lymphs Abs: 1.8 10*3/uL (ref 0.7–4.0)
MCV: 88.7 fL (ref 78.0–100.0)
Neutro Abs: 3.9 10*3/uL (ref 1.7–7.7)
Platelets: 155 10*3/uL (ref 150–400)
RBC: 5.21 MIL/uL — ABNORMAL HIGH (ref 3.87–5.11)
WBC: 6 10*3/uL (ref 4.0–10.5)

## 2012-06-01 MED ORDER — METFORMIN HCL 500 MG PO TABS: 500.0000 mg | ORAL_TABLET | Freq: Two times a day (BID) | ORAL | Status: DC

## 2012-06-01 MED ORDER — MECLIZINE HCL 25 MG PO TABS: 25.0000 mg | ORAL_TABLET | Freq: Three times a day (TID) | ORAL | Status: DC | PRN

## 2012-06-01 MED ORDER — SODIUM CHLORIDE 0.9 % IV BOLUS (SEPSIS)
1000.0000 mL | Freq: Once | INTRAVENOUS | Status: AC
  Administered 2012-06-01: 1000 mL via INTRAVENOUS

## 2012-06-01 MED ORDER — MECLIZINE HCL 25 MG PO TABS
25.0000 mg | ORAL_TABLET | Freq: Once | ORAL | Status: AC
  Administered 2012-06-01: 25 mg via ORAL
  Filled 2012-06-01 (×2): qty 1

## 2012-06-01 MED ORDER — CLINDAMYCIN HCL 300 MG PO CAPS
300.0000 mg | ORAL_CAPSULE | Freq: Once | ORAL | Status: AC
  Administered 2012-06-01: 300 mg via ORAL
  Filled 2012-06-01: qty 1

## 2012-06-01 MED ORDER — METFORMIN HCL 500 MG PO TABS
500.0000 mg | ORAL_TABLET | Freq: Once | ORAL | Status: AC
  Administered 2012-06-01: 500 mg via ORAL
  Filled 2012-06-01: qty 1

## 2012-06-01 MED ORDER — CIPROFLOXACIN HCL 500 MG PO TABS
500.0000 mg | ORAL_TABLET | Freq: Once | ORAL | Status: AC
  Administered 2012-06-01: 500 mg via ORAL
  Filled 2012-06-01: qty 1

## 2012-06-01 NOTE — ED Provider Notes (Signed)
History     CSN: [id number]  Arrival date & time 06/01/12  4098   First MD Initiated Contact with Patient 06/01/12 [phone]      No chief complaint on file.   (Consider location/radiation/quality/duration/timing/severity/associated sxs/prior treatment) HPI Comments: Patient states she was at [facility] last night and diagnosed with BV and started on Cipro and clindamycin. She states since she got home from the hospital she was feeling mildly dizzy which she describes as vertigo and sensation of movement. However since waking up this morning she complains of persistent vertigo as well as the double vision in both eyes were objects or next to each other. She states she has slight headache but no significant pain. She denies any nausea or vomiting but states [facility] had a blood sugar of 350 without known diabetes.  Patient is a 47 y.o. female presenting with neurologic complaint. The history is provided by the patient.  Neurologic Problem This is a new problem. The current episode started 6 to 12 hours ago. The problem occurs constantly. The problem has not changed since onset.Associated symptoms include headaches. Pertinent negatives include no chest pain, no abdominal pain and no shortness of breath. The symptoms are aggravated by walking. Nothing relieves the symptoms. She has tried nothing for the symptoms. The treatment provided no relief.    Past Medical History  Diagnosis Date  . Hypertension     takes hctz  . Arthritis   . Thyroid disease     has a goiter, doesn't need medical management  . Anemia   . Fibroids   . Goiter   . Urinary tract infection   . MRSA (methicillin resistant Staphylococcus aureus)     2005  . Ovarian cyst   . PID (pelvic inflammatory disease)   . Abnormal Pap smear     Past Surgical History  Procedure Laterality Date  . Knee arthroscopy    . Dilation and curettage of uterus    . Salivary stone removal      gland  removed right  . Induced abortion      x 3  . Colposcopy    . Cystoscopy  01/22/2011    Procedure: CYSTOSCOPY;  Surgeon: Myra C. Marice Potter, MD;  Location: [facility];  Service: Gynecology;  Laterality: N/A;    Family History  Problem Relation Age of Onset  . Diabetes Mother   . Cancer Father     throat  . Cancer Brother     leukemia  . Cancer Daughter     breast  . Diabetes Paternal Aunt   . Thyroid disease Maternal Grandmother   . Anesthesia problems Neg Hx     History  Substance Use Topics  . Smoking status: Current Every Day Smoker -- 0.25 packs/day for 30 years    Types: Cigarettes  . Smokeless tobacco: Never Used  . Alcohol Use: No    OB History   Grav Para Term Preterm Abortions TAB SAB Ect Mult Living   5 2 1 1 3 3    1       Review of Systems  Eyes: Positive for visual disturbance.  Respiratory: Negative for shortness of breath.   Cardiovascular: Negative for chest pain.  Gastrointestinal: Negative for abdominal pain.  Neurological: Positive for headaches.  All other systems reviewed and are negative.    Allergies  Shellfish allergy  Home Medications   Current Outpatient Rx  Name  Route  Sig  Dispense  Refill  .  amLODipine (NORVASC) 5 MG tablet   Oral   Take 5 mg by mouth daily.         Marland Kitchen aspirin 81 MG tablet   Oral   Take 81 mg by mouth 3 (three) times a week.          . ciprofloxacin (CIPRO) 500 MG tablet   Oral   Take 1 tablet (500 mg total) by mouth 2 (two) times daily.   10 tablet   0   . clindamycin (CLEOCIN) 300 MG capsule   Oral   Take 1 capsule (300 mg total) by mouth 2 (two) times daily.   14 capsule   0   . Ferrous Sulfate (IRON) 325 (65 FE) MG TABS   Oral   Take 1 capsule by mouth daily.          . hydrochlorothiazide (HYDRODIURIL) 25 MG tablet   Oral   Take 25 mg by mouth daily.            LMP 11/18/2010  Physical Exam  Nursing note and vitals reviewed. Constitutional: She is oriented to person, place, and time.  She appears well-developed and well-nourished. No distress.  HENT:  Head: Normocephalic and atraumatic.  Mouth/Throat: Oropharynx is clear and moist.  Eyes: Conjunctivae and EOM are normal. Pupils are equal, round, and reactive to light.  Complains of double vision in both eyes as well as double vision in each eye separately  Neck: Normal range of motion. Neck supple.  Cardiovascular: Normal rate, regular rhythm and intact distal pulses.   No murmur heard. Pulmonary/Chest: Effort normal and breath sounds normal. No respiratory distress. She has no wheezes. She has no rales.  Abdominal: Soft. She exhibits no distension. There is no tenderness. There is no rebound and no guarding.  Musculoskeletal: Normal range of motion. She exhibits no edema and no tenderness.  Neurological: She is alert and oriented to person, place, and time. She has normal strength. No sensory deficit. Coordination and gait normal.  No facial droop and extraocular movements are intact  Skin: Skin is warm and dry. No rash noted. No erythema.  Psychiatric: She has a normal mood and affect. Her behavior is normal.    ED Course  Procedures (including critical care time)  Labs Reviewed  CBC WITH DIFFERENTIAL - Abnormal; Notable for the following:    RBC 5.21 (*)    Hemoglobin 15.9 (*)    HCT 46.2 (*)    All other components within normal limits  COMPREHENSIVE METABOLIC PANEL - Abnormal; Notable for the following:    Glucose, Bld 258 (*)    Alkaline Phosphatase 144 (*)    All other components within normal limits  GLUCOSE, CAPILLARY - Abnormal; Notable for the following:    Glucose-Capillary 245 (*)    All other components within normal limits   Ct Head Wo Contrast  06/01/2012   *RADIOLOGY REPORT*  Clinical Data: Headache and double vision  CT HEAD WITHOUT CONTRAST  Technique:  Contiguous axial images were obtained from the base of the skull through the vertex without contrast.  Comparison: None.  Findings: No acute  intracranial abnormality is identified. Specifically, no hemorrhage, hydrocephalus, mass effect, midline shift, or evidence of acute infarction.  The visualized paranasal sinuses, mastoid air cells, and middle ears are clear.  The skull is intact.  Soft tissues of the orbits and scalp are unremarkable.  IMPRESSION: No acute intracranial abnormality.   Original Report Authenticated By: Britta Mccreedy, M.D.  1. Diabetes mellitus   2. Vertigo       MDM   Patient is here complaining of double vision, vertigo and feeling lightheaded.  She was seen at [facility] yesterday and diagnosed with bacterial vaginosis as well as documented blood sugar of 350 and was instructed to have PCP followup. Currently patient is taking Cipro and clindamycin for her bacterial vaginosis. She states that she did not have the symptoms last night but since waking up this morning she has had the double vision, vertigo and feeling lightheaded. She complains of a very mild headache but denies any nausea, vomiting, fever, abdominal pain. She has double vision in both eyes separately and together. Otherwise normal neuro exam and patient is not currently ataxic but walks very cautiously. The double vision is horizontal and objects or next to each other.  Concern for worsening hyperglycemia versus intercranial pathology. Low suspicion for stroke at this time. CBC, CMP, head CT pending. Patient given IV fluids   11:30 AM Is within normal limits except for elevated blood sugar. Head CT is negative. On repeat evaluation the patient states the double vision has resolved. However no intervention has been done for this patient for double vision and question whether patient has true double vision. Still complaining of some mild vertiginous symptoms and patient given meclizine. We'll treat with metformin and Antivert.     Gwyneth Sprout, MD 06/01/12 1236

## 2012-06-01 NOTE — ED Notes (Signed)
Dizzy last night before she went to bed [facility] dx w/ uti and gicev rx but she has not picked them up yet still dizzy today slept ok last night

## 2012-06-01 NOTE — ED Notes (Signed)
Pt to ct states feels better

## 2012-06-02 LAB — URINE CULTURE: Colony Count: 30000

## 2012-06-07 NOTE — MAU Provider Note (Signed)
Attestation of Attending Supervision of Advanced Practitioner: Evaluation and management procedures were performed by the PA/NP/CNM/OB Fellow under my supervision/collaboration. Chart reviewed and agree with management and plan.  Larene Ascencio V 06/07/2012 12:40 PM

## 2012-07-17 ENCOUNTER — Encounter (HOSPITAL_COMMUNITY): Payer: Self-pay | Admitting: Emergency Medicine

## 2012-07-17 ENCOUNTER — Emergency Department (HOSPITAL_COMMUNITY)
Admission: EM | Admit: 2012-07-17 | Discharge: 2012-07-17 | Disposition: A | Discharge status: Home / Self Care | Payer: Self-pay | Attending: Emergency Medicine | Admitting: Emergency Medicine

## 2012-07-17 DIAGNOSIS — F17200 Nicotine dependence, unspecified, uncomplicated: Secondary | ICD-10-CM | POA: Insufficient documentation

## 2012-07-17 DIAGNOSIS — Z8614 Personal history of Methicillin resistant Staphylococcus aureus infection: Secondary | ICD-10-CM | POA: Insufficient documentation

## 2012-07-17 DIAGNOSIS — L0291 Cutaneous abscess, unspecified: Secondary | ICD-10-CM

## 2012-07-17 DIAGNOSIS — E119 Type 2 diabetes mellitus without complications: Secondary | ICD-10-CM

## 2012-07-17 DIAGNOSIS — Z79899 Other long term (current) drug therapy: Secondary | ICD-10-CM | POA: Insufficient documentation

## 2012-07-17 DIAGNOSIS — I10 Essential (primary) hypertension: Secondary | ICD-10-CM | POA: Insufficient documentation

## 2012-07-17 DIAGNOSIS — Z8742 Personal history of other diseases of the female genital tract: Secondary | ICD-10-CM | POA: Insufficient documentation

## 2012-07-17 DIAGNOSIS — L02419 Cutaneous abscess of limb, unspecified: Secondary | ICD-10-CM | POA: Insufficient documentation

## 2012-07-17 DIAGNOSIS — Z87440 Personal history of urinary (tract) infections: Secondary | ICD-10-CM | POA: Insufficient documentation

## 2012-07-17 DIAGNOSIS — E079 Disorder of thyroid, unspecified: Secondary | ICD-10-CM | POA: Insufficient documentation

## 2012-07-17 DIAGNOSIS — M129 Arthropathy, unspecified: Secondary | ICD-10-CM | POA: Insufficient documentation

## 2012-07-17 DIAGNOSIS — E1159 Type 2 diabetes mellitus with other circulatory complications: Secondary | ICD-10-CM | POA: Insufficient documentation

## 2012-07-17 MED ORDER — HYDROCODONE-ACETAMINOPHEN 5-325 MG PO TABS: 1.0000 | ORAL_TABLET | Freq: Three times a day (TID) | ORAL | Status: DC | PRN

## 2012-07-17 MED ORDER — SULFAMETHOXAZOLE-TRIMETHOPRIM 800-160 MG PO TABS: 1.0000 | ORAL_TABLET | Freq: Two times a day (BID) | ORAL | Status: DC

## 2012-07-17 MED ORDER — CEPHALEXIN 500 MG PO CAPS: 500.0000 mg | ORAL_CAPSULE | Freq: Four times a day (QID) | ORAL | Status: DC

## 2012-07-17 NOTE — ED Notes (Signed)
Pt requesting resources for getting insurance and PCP. Care Management notified.

## 2012-07-17 NOTE — ED Notes (Signed)
Pt started 3 days ago with "abscess" on right buttock. States is very painful.

## 2012-07-17 NOTE — ED Provider Notes (Signed)
History    CSN: [id number] Arrival date & time 07/17/12  0804  First MD Initiated Contact with Patient 07/17/12 [phone]     Chief Complaint  Patient presents with  . Abscess   (Consider location/radiation/quality/duration/timing/severity/associated sxs/prior Treatment) Patient is a 47 y.o. female presenting with abscess. The history is provided by the patient. No language interpreter was used.  Abscess Associated symptoms: no fever and no headaches   Elizabeth Steele is a 47 y/o F with PMHx of HTN, DM, arthritis, Thyroid disease, anemia, fibroids, MRSA infection of left calf in 2008 presenting to the ED with abscess formation to the right upper thigh, posterior aspect that started late Thursday night, Friday. Patient reported that she feels a pressure - described the discomfort for be as if a "baby is trying to squeezing" out - with radiation of pain down the posterior aspect of the leg. Patient reported that she has been using warm compressions - stating that nothing makes the pain better or worse. Denied fever, chills, numbness, tingling, chest pain, shortness of breath, difficulty breathing, gi symptoms, headache, drainage.  PCP none   Past Medical History  Diagnosis Date  . Hypertension     takes hctz  . Arthritis   . Thyroid disease     has a goiter, doesn't need medical management  . Anemia   . Fibroids   . Goiter   . Urinary tract infection   . MRSA (methicillin resistant Staphylococcus aureus)     2005  . Ovarian cyst   . PID (pelvic inflammatory disease)   . Abnormal Pap smear    Past Surgical History  Procedure Laterality Date  . Knee arthroscopy    . Dilation and curettage of uterus    . Salivary stone removal      gland removed right  . Induced abortion      x 3  . Colposcopy    . Cystoscopy  01/22/2011    Procedure: CYSTOSCOPY;  Surgeon: Myra C. Marice Potter, MD;  Location: [facility];  Service: Gynecology;  Laterality: N/A;  . Abdominal hysterectomy     Family History    Problem Relation Age of Onset  . Diabetes Mother   . Cancer Father     throat  . Cancer Brother     leukemia  . Cancer Daughter     breast  . Diabetes Paternal Aunt   . Thyroid disease Maternal Grandmother   . Anesthesia problems Neg Hx    History  Substance Use Topics  . Smoking status: Current Every Day Smoker -- 0.25 packs/day for 30 years    Types: Cigarettes  . Smokeless tobacco: Never Used  . Alcohol Use: No   OB History   Grav Para Term Preterm Abortions TAB SAB Ect Mult Living   5 2 1 1 3 3    1      Review of Systems  Constitutional: Negative for fever and chills.  HENT: Negative for neck pain.   Respiratory: Negative for chest tightness and shortness of breath.   Cardiovascular: Negative for chest pain.  Musculoskeletal: Negative for back pain.  Skin: Positive for color change.       Abscess   Neurological: Negative for weakness, numbness and headaches.  All other systems reviewed and are negative.    Allergies  Shellfish allergy  Home Medications   Current Outpatient Rx  Name  Route  Sig  Dispense  Refill  . amLODipine (NORVASC) 5 MG tablet   Oral  Take 5 mg by mouth daily.         . hydrochlorothiazide (HYDRODIURIL) 25 MG tablet   Oral   Take 25 mg by mouth daily.          . metFORMIN (GLUCOPHAGE) 500 MG tablet   Oral   Take 1 tablet (500 mg total) by mouth 2 (two) times daily with a meal.   60 tablet   6   . Multiple Vitamins-Minerals (MULTIVITAMIN WITH MINERALS) tablet   Oral   Take 1 tablet by mouth daily.         . cephALEXin (KEFLEX) 500 MG capsule   Oral   Take 1 capsule (500 mg total) by mouth 4 (four) times daily.   20 capsule   0   . HYDROcodone-acetaminophen (NORCO) 5-325 MG per tablet   Oral   Take 1 tablet by mouth every 8 (eight) hours as needed for pain.   5 tablet   0   . sulfamethoxazole-trimethoprim (BACTRIM DS,SEPTRA DS) 800-160 MG per tablet   Oral   Take 1 tablet by mouth 2 (two) times daily. One po  bid x 7 days   14 tablet   0    BP 114/74  Pulse 93  Temp(Src) 98.1 F (36.7 C) (Oral)  SpO2 98%  LMP 11/18/2010 Physical Exam  Nursing note and vitals reviewed. Constitutional: She is oriented to person, place, and time. She appears well-developed and well-nourished. No distress.  HENT:  Head: Normocephalic and atraumatic.  Neck: Normal range of motion. Neck supple.  Cardiovascular: Normal rate, regular rhythm and normal heart sounds.  Exam reveals no friction rub.   No murmur heard. Pulses:      Radial pulses are 2+ on the right side, and 2+ on the left side.       Dorsalis pedis pulses are 2+ on the right side, and 2+ on the left side.  Pulmonary/Chest: Effort normal and breath sounds normal. No respiratory distress. She has no wheezes. She has no rales.  Musculoskeletal:       Legs: Neurological: She is alert and oriented to person, place, and time. She exhibits normal muscle tone. Coordination normal.  Skin: Skin is warm and dry. She is not diaphoretic. There is erythema.  Approximately 2 inches x 1.5 inches circular indurated abscess located to the right upper thigh - erythema noted, warmth upon palpation. Pain upon palpation. Negative streaking noted.   Psychiatric: She has a normal mood and affect. Her behavior is normal. Thought content normal.    ED Course  [place] bedside Date/Time: 07/17/2012 8:57 AM Performed by: Raymon Mutton Authorized by: Raymon Mutton Consent: Verbal consent obtained. Risks and benefits: risks, benefits and alternatives were discussed Consent given by: patient Patient understanding: patient states understanding of the procedure being performed Patient consent: the patient's understanding of the procedure matches consent given Patient identity confirmed: verbally with patient and arm band Time out: Immediately prior to procedure a "time out" was called to verify the correct patient, procedure, equipment, support staff and site/side marked as  required. Local anesthesia used: no Patient sedated: no Patient tolerance: Patient tolerated the procedure well with no immediate complications. Comments: Negative hypoechoic fluid - indurated abscess. Negative fluid noted for drainage.   (including critical care time)   Labs Reviewed  GLUCOSE, CAPILLARY - Abnormal; Notable for the following:    Glucose-Capillary 181 (*)    All other components within normal limits   No results found. 1. Abscess   2. HTN (hypertension)  3. DM (diabetes mellitus)     MDM  Patient presenting to the ED with abscess to the right upper thigh - indurated. [place] at bedside performed - negative hypoechoic fluid noted. Patient stable, afebrile. Discharged patient with antibiotics and pain medications - discussed with patient course, precuations, and disposal techniques. Discussed with patient to refer back to the ED on Friday, 07/21/2012, to get re-evaluated. Discussed with patient to continue to monitor symptoms and if symptoms are to worsen or change to report back to the ED. Patient agreed to plan of care, understood, all questions answered.   Raymon Mutton, PA-C 07/17/12 1858  Raymon Mutton, PA-C 07/17/12 1858

## 2012-07-17 NOTE — Progress Notes (Signed)
Case management consult to help patient with PCP resources.Case management role explained to patient,patient verbalized her understanding.Patient provided with resource sheet for [facility] on [address].Patient receptive to this and stated she will call for an appointment.[place] card education provided.Partnership for community care case worker will  See  - Patient for further assistance.Patient receptive to this.No further Case management needs identified at this time.

## 2012-07-18 NOTE — ED Provider Notes (Signed)
Medical screening examination/treatment/procedure(s) were performed by non-physician practitioner and as supervising physician I was immediately available for consultation/collaboration.  Gwynevere Lizana T Ludmila Ebarb, MD 07/18/12 1516 

## 2012-07-21 ENCOUNTER — Emergency Department (HOSPITAL_COMMUNITY)
Admission: EM | Admit: 2012-07-21 | Discharge: 2012-07-21 | Disposition: A | Discharge status: Home / Self Care | Payer: Self-pay | Attending: Emergency Medicine | Admitting: Emergency Medicine

## 2012-07-21 ENCOUNTER — Encounter (HOSPITAL_COMMUNITY): Payer: Self-pay

## 2012-07-21 DIAGNOSIS — L03317 Cellulitis of buttock: Secondary | ICD-10-CM | POA: Insufficient documentation

## 2012-07-21 DIAGNOSIS — M1990 Unspecified osteoarthritis, unspecified site: Secondary | ICD-10-CM | POA: Insufficient documentation

## 2012-07-21 DIAGNOSIS — Z8742 Personal history of other diseases of the female genital tract: Secondary | ICD-10-CM | POA: Insufficient documentation

## 2012-07-21 DIAGNOSIS — Z79899 Other long term (current) drug therapy: Secondary | ICD-10-CM | POA: Insufficient documentation

## 2012-07-21 DIAGNOSIS — L0231 Cutaneous abscess of buttock: Secondary | ICD-10-CM | POA: Insufficient documentation

## 2012-07-21 DIAGNOSIS — Z862 Personal history of diseases of the blood and blood-forming organs and certain disorders involving the immune mechanism: Secondary | ICD-10-CM | POA: Insufficient documentation

## 2012-07-21 DIAGNOSIS — L0291 Cutaneous abscess, unspecified: Secondary | ICD-10-CM

## 2012-07-21 DIAGNOSIS — I10 Essential (primary) hypertension: Secondary | ICD-10-CM | POA: Insufficient documentation

## 2012-07-21 DIAGNOSIS — Z8614 Personal history of Methicillin resistant Staphylococcus aureus infection: Secondary | ICD-10-CM | POA: Insufficient documentation

## 2012-07-21 DIAGNOSIS — E049 Nontoxic goiter, unspecified: Secondary | ICD-10-CM | POA: Insufficient documentation

## 2012-07-21 DIAGNOSIS — F17200 Nicotine dependence, unspecified, uncomplicated: Secondary | ICD-10-CM | POA: Insufficient documentation

## 2012-07-21 DIAGNOSIS — Z87440 Personal history of urinary (tract) infections: Secondary | ICD-10-CM | POA: Insufficient documentation

## 2012-07-21 MED ORDER — OXYCODONE-ACETAMINOPHEN 5-325 MG PO TABS: 1.0000 | ORAL_TABLET | Freq: Four times a day (QID) | ORAL | Status: DC | PRN

## 2012-07-21 NOTE — ED Provider Notes (Signed)
History    CSN: [id number] Arrival date & time 07/21/12  1246  First MD Initiated Contact with Patient 07/21/12 1351     Chief Complaint  Patient presents with  . Abscess   (Consider location/radiation/quality/duration/timing/severity/associated sxs/prior Treatment) HPI Elizabeth Steele is a 47 y.o.female with a significant PMH of hypertension, arthritis, thyroid disease, anemia, fibroids, goiter, UTI, MRSA, ovarian cysts and PIS presents to the ER with complaints of abscess to right buttock. She was seen 1 week ago and started on Bactrim but it has not helped the area to open up and drain and the area is still firm and tender. It has not grown larger. No fevers, n,v,d weakness. She came in today because she was told it would need to get lanced if it would not open.   Past Medical History  Diagnosis Date  . Hypertension     takes hctz  . Arthritis   . Thyroid disease     has a goiter, doesn't need medical management  . Anemia   . Fibroids   . Goiter   . Urinary tract infection   . MRSA (methicillin resistant Staphylococcus aureus)     2005  . Ovarian cyst   . PID (pelvic inflammatory disease)   . Abnormal Pap smear    Past Surgical History  Procedure Laterality Date  . Knee arthroscopy    . Dilation and curettage of uterus    . Salivary stone removal      gland removed right  . Induced abortion      x 3  . Colposcopy    . Cystoscopy  01/22/2011    Procedure: CYSTOSCOPY;  Surgeon: Myra C. Marice Potter, MD;  Location: [facility];  Service: Gynecology;  Laterality: N/A;  . Abdominal hysterectomy     Family History  Problem Relation Age of Onset  . Diabetes Mother   . Cancer Father     throat  . Cancer Brother     leukemia  . Cancer Daughter     breast  . Diabetes Paternal Aunt   . Thyroid disease Maternal Grandmother   . Anesthesia problems Neg Hx    History  Substance Use Topics  . Smoking status: Current Every Day Smoker -- 0.25 packs/day for 30 years    Types:  Cigarettes  . Smokeless tobacco: Never Used  . Alcohol Use: No   OB History   Grav Para Term Preterm Abortions TAB SAB Ect Mult Living   5 2 1 1 3 3    1      Review of Systems  Skin: Positive for wound.  All other systems reviewed and are negative.    Allergies  Shellfish allergy  Home Medications   Current Outpatient Rx  Name  Route  Sig  Dispense  Refill  . amLODipine (NORVASC) 5 MG tablet   Oral   Take 5 mg by mouth daily.         . cephALEXin (KEFLEX) 500 MG capsule   Oral   Take 1 capsule (500 mg total) by mouth 4 (four) times daily.   20 capsule   0   . hydrochlorothiazide (HYDRODIURIL) 25 MG tablet   Oral   Take 25 mg by mouth daily.          Marland Kitchen HYDROcodone-acetaminophen (NORCO) 5-325 MG per tablet   Oral   Take 1 tablet by mouth every 8 (eight) hours as needed for pain.   5 tablet   0   . metFORMIN (GLUCOPHAGE)  500 MG tablet   Oral   Take 1 tablet (500 mg total) by mouth 2 (two) times daily with a meal.   60 tablet   6   . Multiple Vitamins-Minerals (MULTIVITAMIN WITH MINERALS) tablet   Oral   Take 1 tablet by mouth daily.         Marland Kitchen sulfamethoxazole-trimethoprim (BACTRIM DS,SEPTRA DS) 800-160 MG per tablet   Oral   Take 1 tablet by mouth 2 (two) times daily. One po bid x 7 days   14 tablet   0    BP 139/92  Pulse 94  Temp(Src) 98.3 F (36.8 C) (Oral)  Resp 18  SpO2 97%  LMP 11/18/2010 Physical Exam  Nursing note and vitals reviewed. Constitutional: She appears well-developed and well-nourished. No distress.  HENT:  Head: Normocephalic and atraumatic.  Eyes: Pupils are equal, round, and reactive to light.  Neck: Normal range of motion. Neck supple.  Cardiovascular: Normal rate and regular rhythm.   Pulmonary/Chest: Effort normal.  Abdominal: Soft.  Genitourinary:     Neurological: She is alert.  Skin: Skin is warm and dry.    ED Course  INCISION AND DRAINAGE Date/Time: 07/21/2012 2:47 PM Performed by: Dorthula Matas Authorized by: Dorthula Matas Risks and benefits: risks, benefits and alternatives were discussed Consent given by: patient Type: abscess Body area: anogenital Location details: gluteal cleft Anesthesia: local infiltration Local anesthetic: lidocaine 1% without epinephrine and lidocaine 2% without epinephrine Anesthetic total: 4 ml Patient sedated: no Scalpel size: 11 Needle gauge: 22 Incision type: single straight Complexity: complex Drainage: purulent Drainage amount: moderate Wound treatment: wound left open Patient tolerance: Patient tolerated the procedure well with no immediate complications.   (including critical care time) Labs Reviewed - No data to display No results found. No diagnosis found.  Dx: abscess  MDM  No abx needed. No cellulitis Rx pain medication.  47 y.o.Inza Mikrut Roupp's evaluation in the Emergency Department is complete. It has been determined that no acute conditions requiring further emergency intervention are present at this time. The patient/guardian have been advised of the diagnosis and plan. We have discussed signs and symptoms that warrant return to the ED, such as changes or worsening in symptoms.  Vital signs are stable at discharge. Filed Vitals:   07/21/12 1259  BP: 139/92  Pulse: 94  Temp: 98.3 F (36.8 C)  Resp: 18    Patient/guardian has voiced understanding and agreed to follow-up with the PCP or specialist.   Dorthula Matas, PA-C 07/21/12 1448

## 2012-07-21 NOTE — ED Notes (Signed)
Pt presents with 5 day h/o abscess to back of R upper leg.  Pt reports area began to drain and has been compliant with abx.  Pt reports area continues to have "hard knot".

## 2012-07-21 NOTE — ED Notes (Signed)
Pt was here on Monday for abscess to buttock. Told to return today for recheck. Pt states it is no better.

## 2012-07-25 NOTE — ED Provider Notes (Signed)
Medical screening examination/treatment/procedure(s) were performed by non-physician practitioner and as supervising physician I was immediately available for consultation/collaboration.    Ottie Neglia J. Layden Caterino, MD 07/25/12 1556 

## 2012-10-14 ENCOUNTER — Inpatient Hospital Stay (HOSPITAL_COMMUNITY)
Admission: AD | Admit: 2012-10-14 | Discharge: 2012-10-14 | Disposition: A | Discharge status: Home / Self Care | Payer: Medicaid Other | Source: Ambulatory Visit | Attending: Obstetrics & Gynecology | Admitting: Obstetrics & Gynecology

## 2012-10-14 ENCOUNTER — Encounter (HOSPITAL_COMMUNITY): Payer: Self-pay | Admitting: *Deleted

## 2012-10-14 DIAGNOSIS — L02214 Cutaneous abscess of groin: Secondary | ICD-10-CM

## 2012-10-14 DIAGNOSIS — I10 Essential (primary) hypertension: Secondary | ICD-10-CM | POA: Insufficient documentation

## 2012-10-14 DIAGNOSIS — E119 Type 2 diabetes mellitus without complications: Secondary | ICD-10-CM | POA: Insufficient documentation

## 2012-10-14 DIAGNOSIS — L02219 Cutaneous abscess of trunk, unspecified: Secondary | ICD-10-CM

## 2012-10-14 HISTORY — DX: Furuncle, unspecified: L02.92

## 2012-10-14 HISTORY — DX: Type 2 diabetes mellitus without complications: E11.9

## 2012-10-14 LAB — URINALYSIS, ROUTINE W REFLEX MICROSCOPIC
Glucose, UA: NEGATIVE mg/dL
Hgb urine dipstick: NEGATIVE
Leukocytes, UA: NEGATIVE
Specific Gravity, Urine: >1.03 — ABNORMAL HIGH (ref 1.005–1.030)
Urobilinogen, UA: 0.2 mg/dL (ref 0.0–1.0)

## 2012-10-14 LAB — WET PREP, GENITAL
Trich, Wet Prep: NONE SEEN
Yeast Wet Prep HPF POC: NONE SEEN

## 2012-10-14 MED ORDER — BAYER MICROLET LANCETS MISC: Status: AC

## 2012-10-14 MED ORDER — GLUCOSE BLOOD VI STRP: ORAL_STRIP | Status: AC

## 2012-10-14 MED ORDER — HYDROCHLOROTHIAZIDE 25 MG PO TABS: 25.0000 mg | ORAL_TABLET | Freq: Every day | ORAL | Status: DC

## 2012-10-14 MED ORDER — SULFAMETHOXAZOLE-TRIMETHOPRIM 800-160 MG PO TABS: 1.0000 | ORAL_TABLET | Freq: Two times a day (BID) | ORAL | Status: AC

## 2012-10-14 MED ORDER — AMLODIPINE BESYLATE 5 MG PO TABS: 10.0000 mg | ORAL_TABLET | Freq: Every day | ORAL | Status: DC

## 2012-10-14 NOTE — MAU Note (Signed)
Pt presents with complaints of having a boil in her bikini line.She states that she thought it was an ingrown hair until it started draining. She states that she was diagnosed as being diabetic in May and started taking metformin.

## 2012-10-14 NOTE — MAU Provider Note (Signed)
History     CSN: [id number]  Arrival date and time: 10/14/12 1042   First Provider Initiated Contact with Patient 10/14/12 1119      No chief complaint on file.  HPI [name] is a 47 y.o. [phone] who presents to MAU today with complaint of a "boil" in her vaginal area. The patient states that she has been putting hot compresses on the area and that it has drained some malodorous purulent discharge. The patient denies significant swelling, heat or redness of the affected area. The patient states that she was diagnosed with DM recently. She has been having issues affording the testing and office visits at [facility]. She does have a discount card for her prescriptions at [facility]. She is taking Metformin and still has refills and she has just recently ran out of her HTN medications. She is supposed to be taking 25 mg daily of HCTZ and 10 mg daily of Norvasc. She also does not have Rx for strips and lancets for her glucometer. The patient denies fever and also states later in her visit that she has had BV a few times since DM diagnosis and feels that there has been an odor with a greyish discharge recently. BP is elevated today. She has a mild headache. Denies peripheral edema or blurred vision.    OB History   Grav Para Term Preterm Abortions TAB SAB Ect Mult Living   5 2 1 1 3 3    1       Past Medical History  Diagnosis Date  . Hypertension     takes hctz  . Arthritis   . Thyroid disease     has a goiter, doesn't need medical management  . Anemia   . Fibroids   . Goiter   . Urinary tract infection   . MRSA (methicillin resistant Staphylococcus aureus)     2005  . Ovarian cyst   . PID (pelvic inflammatory disease)   . Abnormal Pap smear   . Boil   . Diabetes mellitus without complication     Past Surgical History  Procedure Laterality Date  . Knee arthroscopy    . Dilation and curettage of uterus    . Salivary stone removal      gland removed right  . Induced  abortion      x 3  . Colposcopy    . Cystoscopy  01/22/2011    Procedure: CYSTOSCOPY;  Surgeon: Myra C. Marice Potter, MD;  Location: [facility];  Service: Gynecology;  Laterality: N/A;  . Abdominal hysterectomy      Family History  Problem Relation Age of Onset  . Diabetes Mother   . Cancer Father     throat  . Cancer Brother     leukemia  . Cancer Daughter     breast  . Diabetes Paternal Aunt   . Thyroid disease Maternal Grandmother   . Anesthesia problems Neg Hx     History  Substance Use Topics  . Smoking status: Current Every Day Smoker -- 0.25 packs/day for 30 years    Types: Cigarettes  . Smokeless tobacco: Never Used  . Alcohol Use: No    Allergies:  Allergies  Allergen Reactions  . Shellfish Allergy Anaphylaxis    Prescriptions prior to admission  Medication Sig Dispense Refill  . amLODipine (NORVASC) 5 MG tablet Take 5 mg by mouth daily.      . hydrochlorothiazide (HYDRODIURIL) 25 MG tablet Take 25 mg by mouth daily.       Marland Kitchen  metFORMIN (GLUCOPHAGE) 500 MG tablet Take 1 tablet (500 mg total) by mouth 2 (two) times daily with a meal.  60 tablet  6  . Multiple Vitamins-Minerals (MULTIVITAMIN WITH MINERALS) tablet Take 1 tablet by mouth daily.        Review of Systems  Constitutional: Negative for fever and malaise/fatigue.  Gastrointestinal: Negative for abdominal pain.  Skin:       + Boil   Physical Exam   Blood pressure 150/100, pulse 94, temperature 98.6 F (37 C), resp. rate 16, last menstrual period 11/18/2010.  Physical Exam  Constitutional: She is oriented to person, place, and time. She appears well-developed and well-nourished. No distress.  HENT:  Head: Normocephalic and atraumatic.  Cardiovascular: Normal rate.   Respiratory: Effort normal.  Neurological: She is alert and oriented to person, place, and time.  Skin: Skin is warm and dry. No erythema.     Psychiatric: She has a normal mood and affect.    MAU Course  Procedures None  MDM Will  give patient 1 month of refills for HTN medications and glucometer supplies. Patient given information about community resources for patient's without insurance.   Assessment and Plan  A: Abscess of the groin, resolving HTN DM  P: Discharge home Rx for Norvasc, HCTZ, test strips and lancets sent to patient's pharmacy Patient given resources for PCP in the area Patient advised of risk of stroke without BP management and signs and symptoms of stroke Patient may return to MAU as needed, however it has been explained that with all of her chronic conditions she should present to [facility] or [facility] in the future  Freddi Starr, PA-C  10/14/2012, 12:00 PM

## 2012-11-23 ENCOUNTER — Ambulatory Visit: Payer: Medicaid Other

## 2013-11-12 ENCOUNTER — Encounter (HOSPITAL_COMMUNITY): Payer: Self-pay | Admitting: *Deleted

## 2013-11-16 ENCOUNTER — Other Ambulatory Visit: Payer: Self-pay

## 2013-11-16 ENCOUNTER — Emergency Department (HOSPITAL_COMMUNITY)
Admission: EM | Admit: 2013-11-16 | Discharge: 2013-11-16 | Disposition: A | Discharge status: Home / Self Care | Payer: Self-pay | Attending: Emergency Medicine | Admitting: Emergency Medicine

## 2013-11-16 ENCOUNTER — Other Ambulatory Visit: Payer: Self-pay | Admitting: Family Medicine

## 2013-11-16 ENCOUNTER — Encounter (HOSPITAL_COMMUNITY): Payer: Self-pay | Admitting: Emergency Medicine

## 2013-11-16 DIAGNOSIS — M1990 Unspecified osteoarthritis, unspecified site: Secondary | ICD-10-CM | POA: Insufficient documentation

## 2013-11-16 DIAGNOSIS — E119 Type 2 diabetes mellitus without complications: Secondary | ICD-10-CM | POA: Insufficient documentation

## 2013-11-16 DIAGNOSIS — L989 Disorder of the skin and subcutaneous tissue, unspecified: Secondary | ICD-10-CM | POA: Insufficient documentation

## 2013-11-16 DIAGNOSIS — Z8742 Personal history of other diseases of the female genital tract: Secondary | ICD-10-CM | POA: Insufficient documentation

## 2013-11-16 DIAGNOSIS — D649 Anemia, unspecified: Secondary | ICD-10-CM | POA: Insufficient documentation

## 2013-11-16 DIAGNOSIS — Z8679 Personal history of other diseases of the circulatory system: Secondary | ICD-10-CM

## 2013-11-16 DIAGNOSIS — Z79899 Other long term (current) drug therapy: Secondary | ICD-10-CM | POA: Insufficient documentation

## 2013-11-16 DIAGNOSIS — E669 Obesity, unspecified: Secondary | ICD-10-CM | POA: Insufficient documentation

## 2013-11-16 DIAGNOSIS — Z720 Tobacco use: Secondary | ICD-10-CM | POA: Insufficient documentation

## 2013-11-16 DIAGNOSIS — Z8614 Personal history of Methicillin resistant Staphylococcus aureus infection: Secondary | ICD-10-CM | POA: Insufficient documentation

## 2013-11-16 DIAGNOSIS — R42 Dizziness and giddiness: Secondary | ICD-10-CM

## 2013-11-16 DIAGNOSIS — Z87440 Personal history of urinary (tract) infections: Secondary | ICD-10-CM | POA: Insufficient documentation

## 2013-11-16 DIAGNOSIS — M5442 Lumbago with sciatica, left side: Secondary | ICD-10-CM | POA: Insufficient documentation

## 2013-11-16 DIAGNOSIS — I10 Essential (primary) hypertension: Secondary | ICD-10-CM | POA: Insufficient documentation

## 2013-11-16 LAB — CBC
HEMATOCRIT: 43.5 % (ref 36.0–46.0)
HEMOGLOBIN: 14.7 g/dL (ref 12.0–15.0)
MCH: 30.8 pg (ref 26.0–34.0)
MCHC: 33.8 g/dL (ref 30.0–36.0)
MCV: 91 fL (ref 78.0–100.0)
Platelets: 157 10*3/uL (ref 150–400)
RBC: 4.78 MIL/uL (ref 3.87–5.11)
RDW: 13.2 % (ref 11.5–15.5)
WBC: 5.7 10*3/uL (ref 4.0–10.5)

## 2013-11-16 LAB — BASIC METABOLIC PANEL
Anion gap: 12 (ref 5–15)
BUN: 17 mg/dL (ref 6–23)
CO2: 28 meq/L (ref 19–32)
Calcium: 9.5 mg/dL (ref 8.4–10.5)
Chloride: 103 mEq/L (ref 96–112)
Creatinine, Ser: 0.63 mg/dL (ref 0.50–1.10)
GFR calc Af Amer: >90 mL/min (ref >90)
GFR calc non Af Amer: >90 mL/min (ref >90)
GLUCOSE: 156 mg/dL — AB (ref 70–99)
POTASSIUM: 4 meq/L (ref 3.7–5.3)
SODIUM: 143 meq/L (ref 137–147)

## 2013-11-16 MED ORDER — AMLODIPINE BESYLATE 5 MG PO TABS: 10.0000 mg | ORAL_TABLET | Freq: Every day | ORAL | Status: DC

## 2013-11-16 MED ORDER — MECLIZINE HCL 25 MG PO TABS
25.0000 mg | ORAL_TABLET | Freq: Once | ORAL | Status: AC
  Administered 2013-11-16: 25 mg via ORAL
  Filled 2013-11-16: qty 1

## 2013-11-16 MED ORDER — HYDROCHLOROTHIAZIDE 25 MG PO TABS
25.0000 mg | ORAL_TABLET | Freq: Every day | ORAL | Status: DC
  Administered 2013-11-16: 25 mg via ORAL
  Filled 2013-11-16: qty 1

## 2013-11-16 MED ORDER — HYDROCHLOROTHIAZIDE 25 MG PO TABS: 25.0000 mg | ORAL_TABLET | Freq: Every day | ORAL | Status: DC

## 2013-11-16 MED ORDER — HYDROCODONE-ACETAMINOPHEN 5-325 MG PO TABS
1.0000 | ORAL_TABLET | Freq: Once | ORAL | Status: AC
Start: 2013-11-16 — End: 2013-11-16
  Administered 2013-11-16: 1 via ORAL
  Filled 2013-11-16: qty 1

## 2013-11-16 MED ORDER — MECLIZINE HCL 50 MG PO TABS: 50.0000 mg | ORAL_TABLET | Freq: Three times a day (TID) | ORAL | Status: DC | PRN

## 2013-11-16 MED ORDER — AMLODIPINE BESYLATE 5 MG PO TABS
5.0000 mg | ORAL_TABLET | Freq: Once | ORAL | Status: AC
  Administered 2013-11-16: 5 mg via ORAL
  Filled 2013-11-16: qty 1

## 2013-11-16 MED ORDER — HYDROCODONE-ACETAMINOPHEN 5-325 MG PO TABS: 1.0000 | ORAL_TABLET | Freq: Four times a day (QID) | ORAL | Status: DC | PRN

## 2013-11-16 MED ORDER — IBUPROFEN 800 MG PO TABS: 800.0000 mg | ORAL_TABLET | Freq: Three times a day (TID) | ORAL | Status: DC

## 2013-11-16 MED ORDER — SODIUM CHLORIDE 0.9 % IV BOLUS (SEPSIS): 1000.0000 mL | Freq: Once | INTRAVENOUS | Status: DC

## 2013-11-16 NOTE — Discharge Instructions (Signed)
Continue to monitor your blood pressure, if it remains low do not take your hypertensive medications. If you feel lightheaded sit down. Call for a follow up appointment with a Family or Primary Care Provider.  Return if Symptoms worsen.   Take medication as prescribed.   Emergency Department Resource Guide 1) Find a Doctor and Pay Out of Pocket Although you won't have to find out who is covered by your insurance plan, it is a good idea to ask around and get recommendations. You will then need to call the office and see if the doctor you have chosen will accept you as a new patient and what types of options they offer for patients who are self-pay. Some doctors offer discounts or will set up payment plans for their patients who do not have insurance, but you will need to ask so you aren't surprised when you get to your appointment.  2) Contact Your Local Health Department Not all health departments have doctors that can see patients for sick visits, but many do, so it is worth a call to see if yours does. If you don't know where your local health department is, you can check in your phone book. The CDC also has a tool to help you locate your state's health department, and many state websites also have listings of all of their local health departments.  3) Find a [facility] If your illness is not likely to be very severe or complicated, you may want to try a walk in clinic. These are popping up all over the country in pharmacies, drugstores, and shopping centers. They're usually staffed by nurse practitioners or physician assistants that have been trained to treat common illnesses and complaints. They're usually fairly quick and inexpensive. However, if you have serious medical issues or chronic medical problems, these are probably not your best option.  No Primary Care Doctor: - Call Health Connect at  [phone] - they can help you locate a primary care doctor that  accepts your insurance,  provides certain services, etc. - Physician Referral Service- [phone]  Chronic Pain Problems: Organization         Address  Phone   Notes  [facility]  [phone] Patients need to be referred by their primary care doctor.   Medication Assistance: Organization         Address  Phone   Notes  [facility] Medication [facility] [address] [place], [place] [address] [phone] --Must be a resident of [facility] -- Must have NO insurance coverage whatsoever (no Medicaid/ Medicare, etc.) -- The pt. MUST have a primary care doctor that directs their care regularly and follows them in the community   MedAssist  [phone]   [employer]  [phone]    Agencies that provide inexpensive medical care: Organization         Address  Phone   Notes  [place]  [phone]   [facility]    [phone]   [facility] [place], [place] [address] [phone]   [place] [address] [place] [phone]   Planned Parenthood    [phone]   [facility]    [phone]   [place] and [place] [address], [place] Phone:  [phone], Fax:  [phone] Hours of Operation:  9 am - 6 pm,  M-F.  Also accepts Medicaid/Medicare and self-pay.  [facility] for [address], [address], [place] Phone: [phone], Fax: [phone]. Hours of Operation:  8:30 am - 5:30 pm, M-F.  Also accepts Medicaid and self-pay.  [facility] [place] [address] [place] Phone: [phone]   [place], [place], [place] [phone], Ext. 123 Mondays & Thursdays: 7-9 AM.  First 15 patients are seen on a first come, first serve basis.    [place] Providers:  Organization         Address  Phone    Notes  [facility] [address], [place] [phone] Also accepts self-pay patients.  [facility] [address], [place]  [phone]   [place], [address], [place] [phone]   [facility] [address], [place] [phone]   Lucianne Lei [address], [place]   [phone] Only accepts [place] Access [place] patients after they have their name applied to their card.   Self-Pay (no insurance) in [facility]:  Organization         Address  Phone   Notes  Sickle Cell Patients, [facility] [place] [phone]   [facility] Urgent Care [place] [phone]   [facility] [place]  [place], [place], [place] [phone]   [facility]/Dr. Osei-Bonsu  [address] [place] or [address], [place] [phone] Phone number for both [place] and [place] locations is the same.  Urgent Medical and [facility] [address] [place] [phone]   [facility] [address], [place] or [address] [phone]   [facility] [address], [place] [phone], phone; [phone], fax Sees patients 1st and 3rd Saturday of every month.  Must not qualify for public or private insurance (i.e. Medicaid, Medicare, [place] Health Choice, Veterans' Benefits)  Household income should be no more than 200% of the poverty level The clinic cannot treat you if you are pregnant or think you are pregnant  Sexually transmitted diseases are not treated at the clinic.    Dental Care: Organization         Address  Phone  Notes  [facility] Department of [facility] [place] [phone] Accepts children up to age 69 who are enrolled in [place] or [place]; pregnant women with a Medicaid card; and children who have applied for Medicaid or [place] Health Choice, but were declined, whose parents can pay a reduced fee at time of service.  [facility] Department of [facility]  [address], [place] [phone] Accepts children up to age 14 who are enrolled in [place] or [place]; pregnant women with a Medicaid card; and children who have applied for Medicaid or [place] Health Choice, but were declined, whose parents can pay a reduced fee at time of service.  [place] Adult Dental Access PROGRAM  [place] [phone] Patients are seen by appointment only. Walk-ins are not accepted. [place] will see patients 67 years of age and older. Monday -  Tuesday (8am-5pm) Most Wednesdays (8:30-5pm) $30 per visit, cash only  [facility] Adult Dental Access PROGRAM  [address], [facility] [phone] Patients are seen by appointment only. Walk-ins are not accepted. [place] will see patients 30 years of age and older. One Wednesday Evening (Monthly: Volunteer Based).  $30 per visit, cash only  [place]  [phone] for adults; Children under age 57, call Graduate Pediatric Dentistry at [phone]. Children aged 69-14, please call [phone] to request a pediatric application.  Dental services are provided in all areas of dental care including fillings, crowns and bridges, complete and partial dentures, implants, gum treatment, root canals, and extractions. Preventive care is also provided. Treatment is provided to both adults and children. Patients are selected via a lottery and there is often a waiting list.   [facility] [address] [place]  [phone] [email or web address]   [facility] [address] [place], [place] [phone], Ext.  123 Second and Fourth Thursday of each month, opens at 6:30 AM; Clinic ends at 9 AM.  Patients are seen on a first-come first-served basis, and a limited number are seen during each clinic.   [facility]  [address] Hillard Danker [place], [place] [phone]   Eligibility Requirements You must have lived in [place], [place], or [place] counties for at least the last three months.   You cannot be eligible for state or federal sponsored [employer], including [employer], [place], or [employer].   You generally cannot be eligible for healthcare insurance through your employer.    How to apply: Eligibility screenings are held every Tuesday and Wednesday afternoon from 1:00 pm until 4:00 pm. You do not need an appointment for the interview!  [facility] [address], [place], [place]   [place]  [place] Department  [place]  [phone]    Behavioral Health Resources in the Community: Intensive Outpatient Programs Organization         Address  Phone  Notes  [place]. [address], [place], [place] [phone]   [facility] Outpatient [address], [place], [place]   ADS: Alcohol & Drug Svcs [address] [place], [place]   [place] [address],  [place], [place] or [phone]   Substance Abuse Resources Organization         Address  Phone  Notes  Alcohol and Drug Services  [phone]   [place]  [phone]   [facility] [place]   Chinita Pester  [phone]   Residential & Outpatient Substance Abuse Program  [phone]   Psychological Services Organization         Address  Phone  Notes  [facility] [place]  [place]  [phone]   [place] [address] [place] or [phone]    Mobile Crisis Teams Organization         Address  Phone  Notes  [facility], Mobile Crisis Care Unit  [phone]   [facility] Psychotherapeutic Services  [address] [place], [place]   [place] [address] [place] [phone]    Self-Help/Support Groups Organization         Address  Phone  Notes  Mental Health Assoc. of [place] - variety of support groups  [place] Call for more information  Narcotics Anonymous (NA), [facility] [address], [employer] [place]  2 meetings at this location   Special educational needs teacher         Address  Phone  Notes  ASAP Residential Treatment [place],    [place]  [phone]   [facility]  [address], [place] [id number], [place], [place]   [place], [place] [phone] Admissions: 8am-3pm M-F  Incentives Substance [place] [address],    [place], [place] [phone]   [facility] [address] [place], [place], [place]   [facility] [address]  [place], [place]   [facility] Programs - Intensive Outpatient [address] Kristeen [place] [address], [place], [place]   [facility] [address]  [place], [place] [phone] or [phone]   [facility] Treatment Services (RTS) [address] [place], [place] Accepts Medicaid  [facility] [place] [address],  [place] [phone] Substance Abuse/Addiction Treatment   [facility] Organization         Address  Phone  Notes  [facility]  [phone]   Domenic Schwab, PhD [address] Arlis Porta [place], [place]   [phone] or [phone]   [place]  [place], [place] [phone]   [facility] [address] [place], [place] [phone] Insurance/Medicaid/sponsorship through [facility] and Families [address]                                    [place], [place] [phone] [place] [address] [place], [place] [phone]    Dr. Adele Schilder  [phone]   [facility] [place] Dept. 1) [address]  2) [place] 3)  [address], [place] [phone]  [phone]   [place] [phone] or [phone] (After Hours)

## 2013-11-16 NOTE — ED Notes (Signed)
Patient here with multiple complaints.   Patient states dizziness secondary to not having taken blood pressure medicine since Monday.  "I ran out and need to get a refill".   Patient states "I always get sorta off when my pressures are high.  I've been dizzy".   Patient explained dizziness as "my equilibrium just gets off and I have ringing in my ears".   Patient also complains of chronic sciatica.   Patient states "I've been dealing with that for 10 years.  It sorta comes up in episodes".    Pain lower back going into L leg.

## 2013-11-16 NOTE — ED Provider Notes (Signed)
CSN: [id number]     Arrival date & time 11/16/13  1032 History   First MD Initiated Contact with Patient 11/16/13 1151     Chief Complaint  Patient presents with  . Dizziness  . Sciatica     (Consider location/radiation/quality/duration/timing/severity/associated sxs/prior Treatment) HPI Comments: The patient is a 48 year old female with past history of hypertension, diabetes presenting to the emergency department chief complaint of dizziness and left lower back pain. Patient reports intermittent lightheaded and dizziness, relating it to noncompliance with antihypertensive medication since Monday. Denies nausea, vomiting, syncope, denies room spitting vertigo symptoms. She reports history of vertigo in the past. Reports occasional ringing. Denies aggravating factors including, body position, movement of head, or relieving factors. Patient denies nausea, vomiting, syncope.  The history is provided by the patient. No language interpreter was used.    Past Medical History  Diagnosis Date  . Hypertension     takes hctz  . Arthritis   . Thyroid disease     has a goiter, doesn't need medical management  . Anemia   . Fibroids   . Goiter   . Urinary tract infection   . MRSA (methicillin resistant Staphylococcus aureus)     2005  . Ovarian cyst   . PID (pelvic inflammatory disease)   . Abnormal Pap smear   . Boil   . Diabetes mellitus without complication    Past Surgical History  Procedure Laterality Date  . Knee arthroscopy    . Dilation and curettage of uterus    . Salivary stone removal      gland removed right  . Induced abortion      x 3  . Colposcopy    . Cystoscopy  01/22/2011    Procedure: CYSTOSCOPY;  Surgeon: Myra C. Hulan Fray, MD;  Location: [facility];  Service: Gynecology;  Laterality: N/A;  . Abdominal hysterectomy     Family History  Problem Relation Age of Onset  . Diabetes Mother   . Cancer Father     throat  . Cancer Brother     leukemia  . Cancer Daughter    breast  . Diabetes Paternal Aunt   . Thyroid disease Maternal Grandmother   . Anesthesia problems Neg Hx    History  Substance Use Topics  . Smoking status: Current Every Day Smoker -- 0.50 packs/day for 30 years    Types: Cigarettes  . Smokeless tobacco: Never Used  . Alcohol Use: No   OB History    Gravida Para Term Preterm AB TAB SAB Ectopic Multiple Living   5 2 1 1 3 3    1      Review of Systems  Constitutional: Negative for fever and chills.  HENT: Positive for congestion and tinnitus. Negative for ear pain.   Eyes: Negative for photophobia and visual disturbance.  Respiratory: Negative for shortness of breath.   Cardiovascular: Negative for chest pain.  Gastrointestinal: Negative for nausea, vomiting and abdominal pain.  Musculoskeletal: Positive for back pain. Negative for gait problem.  Neurological: Positive for dizziness and light-headedness. Negative for syncope, weakness, numbness and headaches.      Allergies  Shellfish allergy  Home Medications   Prior to Admission medications   Medication Sig Start Date End Date Taking? Authorizing Provider  metFORMIN (GLUCOPHAGE) 500 MG tablet Take 1 tablet (500 mg total) by mouth 2 (two) times daily with a meal. 06/01/12  Yes Blanchie Dessert, MD  amLODipine (NORVASC) 5 MG tablet Take 2 tablets (10 mg total) by mouth  daily. 10/14/12   Luvenia Redden, PA-C  BAYER MICROLET LANCETS lancets Use as instructed 10/14/12   Luvenia Redden, PA-C  glucose blood (BAYER CONTOUR NEXT TEST) test strip Use as instructed 10/14/12   Luvenia Redden, PA-C  hydrochlorothiazide (HYDRODIURIL) 25 MG tablet Take 1 tablet (25 mg total) by mouth daily. 10/14/12   Luvenia Redden, PA-C  Multiple Vitamins-Minerals (MULTIVITAMIN WITH MINERALS) tablet Take 1 tablet by mouth daily.    Historical Provider, MD   BP 138/95 mmHg  Pulse 82  Temp(Src) 98.1 F (36.7 C) (Oral)  Resp 18  Ht 5\' 6"  (1.676 m)  Wt 225 lb (102.059 kg)  BMI 36.33 kg/m2  SpO2 98%   LMP 11/18/2010 Physical Exam  Constitutional: She is oriented to person, place, and time. She appears well-developed and well-nourished. No distress.  Obese female  HENT:  Head: Normocephalic and atraumatic.  Right Ear: Tympanic membrane and external ear normal. Tympanic membrane is not retracted and not bulging. No middle ear effusion.  Left Ear: Tympanic membrane and external ear normal. Tympanic membrane is not retracted and not bulging.  No middle ear effusion.  Eyes: EOM are normal. Pupils are equal, round, and reactive to light.  Cardiovascular: Normal rate and regular rhythm.   Pulmonary/Chest: Effort normal and breath sounds normal. No respiratory distress. She has no wheezes. She has no rales.  Abdominal: Soft. There is no tenderness.  Musculoskeletal:       Back:  Discomfort recuperated with palpation of left SI joint, recreates lower extremity discomfort.  Neurological: She is alert and oriented to person, place, and time.  Speech is clear and goal oriented, follows commands II-Visual fields were normal.   III/IV/VI-Pupils were equal and reacted. Extraocular movements were full and conjugate.  V/VII-no facial droop.   VIII-normal.   Motor: Strength 5/5 to upper and lower extremities bilaterally. Moves all 4 extremities equally. Sensory: normal sensation to upper and lower extremities.   Skin: Skin is warm and dry.  Left inner thigh: single pimple/pustular lesion with minimal erythema.  Psychiatric: She has a normal mood and affect. Her behavior is normal. Thought content normal.  Nursing note and vitals reviewed.   ED Course  Procedures (including critical care time) Labs Review Labs Reviewed  BASIC METABOLIC PANEL - Abnormal; Notable for the following:    Glucose, Bld 156 (*)    All other components within normal limits  CBC    Imaging Review No results found.   EKG Interpretation None      MDM   Final diagnoses:  Lightheaded  History of hypertension    Left-sided low back pain with left-sided sciatica   Pt presents with questionable lightheadedness vs dizziness. Patient denies current symptoms. No neurologic deficits. Plan to treat for possible vertigo.  Patient also relates to elevated blood pressure since noncompliance with hypertensive, initial blood pressure 130/95, patient is orthostatic. Patient is eating and drinking in ED. Labs ordered prior to my exam, labs without concerning abnormalities.  Also complains of small pustule/pimple on lower extremity, discussed warm compress and popping it. Pt declined opening it up with a needle in ED. Plan to treat left-sided sciatica with NSAIDs and norco.  Pt was seen by Dr. Steffanie Dunn during this encounter. Pt reports feeling better, plan to discharge home. After discharge pt called from pharmacy requesting metformin refill, one month refill called in. Meds given in ED:  Medications  hydrochlorothiazide (HYDRODIURIL) tablet 25 mg (25 mg Oral Given 11/16/13 1240)  meclizine (ANTIVERT)  tablet 25 mg (25 mg Oral Given 11/16/13 1240)  amLODipine (NORVASC) tablet 5 mg (5 mg Oral Given 11/16/13 1240)  HYDROcodone-acetaminophen (NORCO/VICODIN) 5-325 MG per tablet 1 tablet (1 tablet Oral Given 11/16/13 1259)    Discharge Medication List as of 11/16/2013  2:08 PM    START taking these medications   Details  meclizine (ANTIVERT) 50 MG tablet Take 1 tablet (50 mg total) by mouth 3 (three) times daily as needed., Starting 11/16/2013, Until Discontinued, Print        Harvie Heck, PA-C 11/16/13 1509  Mariea Clonts, MD 11/16/13 1610

## 2014-03-08 ENCOUNTER — Other Ambulatory Visit: Payer: Self-pay | Admitting: Family Medicine

## 2014-03-22 ENCOUNTER — Other Ambulatory Visit (HOSPITAL_COMMUNITY): Payer: Self-pay | Admitting: Registered Nurse

## 2014-03-22 DIAGNOSIS — Z1231 Encounter for screening mammogram for malignant neoplasm of breast: Secondary | ICD-10-CM

## 2014-03-29 ENCOUNTER — Ambulatory Visit (HOSPITAL_COMMUNITY): Payer: Self-pay

## 2014-04-02 ENCOUNTER — Ambulatory Visit (HOSPITAL_COMMUNITY)
Admission: RE | Admit: 2014-04-02 | Discharge: 2014-04-02 | Disposition: A | Discharge status: Home / Self Care | Payer: 59 | Source: Ambulatory Visit | Attending: Registered Nurse | Admitting: Registered Nurse

## 2014-04-02 DIAGNOSIS — Z1231 Encounter for screening mammogram for malignant neoplasm of breast: Secondary | ICD-10-CM | POA: Diagnosis present

## 2014-07-10 ENCOUNTER — Emergency Department (HOSPITAL_COMMUNITY): Payer: 59

## 2014-07-10 ENCOUNTER — Emergency Department (HOSPITAL_COMMUNITY)
Admission: EM | Admit: 2014-07-10 | Discharge: 2014-07-10 | Disposition: A | Discharge status: Home / Self Care | Payer: 59 | Attending: Emergency Medicine | Admitting: Emergency Medicine

## 2014-07-10 ENCOUNTER — Encounter (HOSPITAL_COMMUNITY): Payer: Self-pay | Admitting: Emergency Medicine

## 2014-07-10 DIAGNOSIS — Z720 Tobacco use: Secondary | ICD-10-CM | POA: Insufficient documentation

## 2014-07-10 DIAGNOSIS — I10 Essential (primary) hypertension: Secondary | ICD-10-CM | POA: Diagnosis not present

## 2014-07-10 DIAGNOSIS — Z86018 Personal history of other benign neoplasm: Secondary | ICD-10-CM | POA: Insufficient documentation

## 2014-07-10 DIAGNOSIS — Z87440 Personal history of urinary (tract) infections: Secondary | ICD-10-CM | POA: Diagnosis not present

## 2014-07-10 DIAGNOSIS — Z862 Personal history of diseases of the blood and blood-forming organs and certain disorders involving the immune mechanism: Secondary | ICD-10-CM | POA: Diagnosis not present

## 2014-07-10 DIAGNOSIS — Z8614 Personal history of Methicillin resistant Staphylococcus aureus infection: Secondary | ICD-10-CM | POA: Insufficient documentation

## 2014-07-10 DIAGNOSIS — E119 Type 2 diabetes mellitus without complications: Secondary | ICD-10-CM | POA: Diagnosis not present

## 2014-07-10 DIAGNOSIS — M1990 Unspecified osteoarthritis, unspecified site: Secondary | ICD-10-CM | POA: Diagnosis not present

## 2014-07-10 DIAGNOSIS — M546 Pain in thoracic spine: Secondary | ICD-10-CM | POA: Diagnosis present

## 2014-07-10 DIAGNOSIS — Z79899 Other long term (current) drug therapy: Secondary | ICD-10-CM | POA: Insufficient documentation

## 2014-07-10 DIAGNOSIS — Z8742 Personal history of other diseases of the female genital tract: Secondary | ICD-10-CM | POA: Insufficient documentation

## 2014-07-10 MED ORDER — IBUPROFEN 800 MG PO TABS: 800.0000 mg | ORAL_TABLET | Freq: Three times a day (TID) | ORAL | Status: AC

## 2014-07-10 MED ORDER — DIAZEPAM 5 MG PO TABS: 5.0000 mg | ORAL_TABLET | Freq: Two times a day (BID) | ORAL | Status: AC

## 2014-07-10 MED ORDER — DIAZEPAM 5 MG PO TABS
5.0000 mg | ORAL_TABLET | Freq: Once | ORAL | Status: AC
  Administered 2014-07-10: 5 mg via ORAL
  Filled 2014-07-10: qty 1

## 2014-07-10 MED ORDER — KETOROLAC TROMETHAMINE 30 MG/ML IJ SOLN
30.0000 mg | Freq: Once | INTRAMUSCULAR | Status: AC
  Administered 2014-07-10: 30 mg via INTRAMUSCULAR
  Filled 2014-07-10: qty 1

## 2014-07-10 NOTE — Discharge Instructions (Signed)
As discussed, your evaluation today has been largely reassuring.  But, it is important that you monitor your condition carefully, and do not hesitate to return to the ED if you develop new, or concerning changes in your condition.   The contact information for a local orthopedic and physical therapy has been provided.  This is not a formal referral, but these practitioners will be of assistance in obtaining additional relief from your back pain, neck pain.

## 2014-07-10 NOTE — ED Provider Notes (Signed)
CSN: [id number]     Arrival date & time 07/10/14  1054 History   First MD Initiated Contact with Patient 07/10/14 1055     Chief Complaint  Patient presents with  . Back Pain    HPI  Patient presents with concern of back pain. Pain began about 3 days ago, is from the left paraspinal region on the down to the lower thoracic left lateral spine, and parascapular area. Pain is sore, severe, tight, worse with motion of the head and neck. Pain is increased in spite of using OTC medication. Pain is worse with motion, and breathing, though the patient is not short of breath, and denies any chest pain.  Patient smokes  Smoking cessation provided, particularly in light of this patient's evaluation in the ED.   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 Patient has a history of prior cyst on the left scapular area, now status post removal. She voices some concern about recurrence.   Past Medical History  Diagnosis Date  . Hypertension     takes hctz  . Arthritis   . Thyroid disease     has a goiter, doesn't need medical management  . Anemia   . Fibroids   . Goiter   . Urinary tract infection   . MRSA (methicillin resistant Staphylococcus aureus)     2005  . Ovarian cyst   . PID (pelvic inflammatory disease)   . Abnormal Pap smear   . Boil   . Diabetes mellitus without complication    Past Surgical History  Procedure Laterality Date  . Knee arthroscopy    . Dilation and curettage of uterus    . Salivary stone removal      gland removed right  . Induced abortion      x 3  . Colposcopy    . Cystoscopy  01/22/2011    Procedure: CYSTOSCOPY;  Surgeon: Myra C. Hulan Fray, MD;  Location: [facility];  Service: Gynecology;  Laterality: N/A;  . Abdominal hysterectomy     Family History  Problem Relation Age of Onset  . Diabetes Mother   . Cancer Father     throat  . Cancer Brother       leukemia  . Cancer Daughter     breast  . Diabetes Paternal Aunt   . Thyroid disease Maternal Grandmother   . Anesthesia problems Neg Hx    History  Substance Use Topics  . Smoking status: Current Every Day Smoker -- 0.50 packs/day for 30 years    Types: Cigarettes  . Smokeless tobacco: Never Used  . Alcohol Use: No   OB History    Gravida Para Term Preterm AB TAB SAB Ectopic Multiple Living   5 2 1 1 3 3    1      Review of Systems  Constitutional:       Per HPI, otherwise negative  HENT:       Per HPI, otherwise negative  Respiratory:       Per HPI, otherwise negative  Cardiovascular:       Per HPI, otherwise negative  Gastrointestinal: Negative for vomiting.  Endocrine:       Negative aside from HPI  Genitourinary:       Neg aside from HPI   Musculoskeletal:       Per HPI, otherwise negative  Skin: Negative.  Negative for wound.  Neurological:  Negative for syncope.      Allergies  Shellfish allergy  Home Medications   Prior to Admission medications   Medication Sig Start Date End Date Taking? Authorizing Provider  amLODipine (NORVASC) 5 MG tablet Take 2 tablets (10 mg total) by mouth daily. 10/14/12   Luvenia Redden, PA-C  amLODipine (NORVASC) 5 MG tablet Take 2 tablets (10 mg total) by mouth daily. 11/16/13   Harvie Heck, PA-C  BAYER MICROLET LANCETS lancets Use as instructed 10/14/12   Luvenia Redden, PA-C  glucose blood (BAYER CONTOUR NEXT TEST) test strip Use as instructed 10/14/12   Luvenia Redden, PA-C  hydrochlorothiazide (HYDRODIURIL) 25 MG tablet Take 1 tablet (25 mg total) by mouth daily. 10/14/12   Luvenia Redden, PA-C  hydrochlorothiazide (HYDRODIURIL) 25 MG tablet Take 1 tablet (25 mg total) by mouth daily. 11/16/13   Harvie Heck, PA-C  HYDROcodone-acetaminophen (NORCO/VICODIN) 5-325 MG per tablet Take 1 tablet by mouth every 6 (six) hours as needed for moderate pain or severe pain. 11/16/13   Harvie Heck, PA-C  ibuprofen (ADVIL,MOTRIN) 800 MG  tablet Take 1 tablet (800 mg total) by mouth 3 (three) times daily. 11/16/13   Harvie Heck, PA-C  meclizine (ANTIVERT) 50 MG tablet Take 1 tablet (50 mg total) by mouth 3 (three) times daily as needed. 11/16/13   Harvie Heck, PA-C  metFORMIN (GLUCOPHAGE) 500 MG tablet Take 1 tablet (500 mg total) by mouth 2 (two) times daily with a meal. 06/01/12   Blanchie Dessert, MD  Multiple Vitamins-Minerals (MULTIVITAMIN WITH MINERALS) tablet Take 1 tablet by mouth daily.    Historical Provider, MD   BP 161/94 mmHg  Pulse 86  Temp(Src) 97.8 F (36.6 C) (Oral)  Resp 16  Ht 5\' 5"  (1.651 m)  Wt 192 lb (87.091 kg)  BMI 31.95 kg/m2  SpO2 94%  LMP 11/18/2010 Physical Exam  Constitutional: She is oriented to person, place, and time. She appears well-developed and well-nourished. No distress.  HENT:  Head: Normocephalic and atraumatic.  Eyes: Conjunctivae and EOM are normal.  Neck: Neck supple. No spinous process tenderness and no muscular tenderness present. No erythema and normal range of motion present. No thyroid mass present.  Cardiovascular: Normal rate and regular rhythm.   Pulmonary/Chest: Effort normal and breath sounds normal. No stridor. No respiratory distress.  Abdominal: She exhibits no distension.  Musculoskeletal: She exhibits no edema.       Arms: Neurological: She is alert and oriented to person, place, and time. No cranial nerve deficit. She exhibits normal muscle tone. Coordination normal.  Skin: Skin is warm and dry.  Psychiatric: She has a normal mood and affect.  Nursing note and vitals reviewed.   ED Course  Procedures (including critical care time)   I saw the x-ray - I agree with the interpretation.   Pulse oximetry 95% room air borderline  On repeat exam the patient appears better, states that she feels better. We discussed all findings, the need for outpatient follow-up.  MDM  Generally well-appearing female presents with concern of left-sided paraspinal  pain. No evidence for neurovascular compromise, patient has chronic neuropathy, though this is unchanged. No evidence for infection, pulmonary embolism. No evidence for ACS. Patient improved here with muscle relaxant, anti-inflammatory. Patient was discharged with these, and referral to a physical therapy center.  Carmin Muskrat, MD 07/10/14 1300

## 2014-07-10 NOTE — ED Notes (Signed)
Pain in left neck starting Monday; took Ibuprofen, felt better, but felt like a strained muscle in the neck. Continually worse every day. Secondary concern that she had a cyst 7 years ago in same area (it looked like a blackhead on the outside, but was the size of a lemon underneath)... Concerned the cyst is back and that may be causing pain. Pain with elevating left arm.

## 2015-08-26 ENCOUNTER — Emergency Department (HOSPITAL_COMMUNITY)
Admission: EM | Admit: 2015-08-26 | Discharge: 2015-08-26 | Disposition: A | Discharge status: Home / Self Care | Payer: Self-pay | Attending: Physician Assistant | Admitting: Physician Assistant

## 2015-08-26 ENCOUNTER — Encounter (HOSPITAL_COMMUNITY): Payer: Self-pay | Admitting: Emergency Medicine

## 2015-08-26 DIAGNOSIS — E119 Type 2 diabetes mellitus without complications: Secondary | ICD-10-CM | POA: Insufficient documentation

## 2015-08-26 DIAGNOSIS — M546 Pain in thoracic spine: Secondary | ICD-10-CM

## 2015-08-26 DIAGNOSIS — I10 Essential (primary) hypertension: Secondary | ICD-10-CM | POA: Insufficient documentation

## 2015-08-26 DIAGNOSIS — M5412 Radiculopathy, cervical region: Secondary | ICD-10-CM | POA: Insufficient documentation

## 2015-08-26 DIAGNOSIS — F17210 Nicotine dependence, cigarettes, uncomplicated: Secondary | ICD-10-CM | POA: Insufficient documentation

## 2015-08-26 DIAGNOSIS — Z79899 Other long term (current) drug therapy: Secondary | ICD-10-CM | POA: Insufficient documentation

## 2015-08-26 DIAGNOSIS — Z7984 Long term (current) use of oral hypoglycemic drugs: Secondary | ICD-10-CM | POA: Insufficient documentation

## 2015-08-26 MED ORDER — KETOROLAC TROMETHAMINE 15 MG/ML IJ SOLN
15.0000 mg | Freq: Once | INTRAMUSCULAR | Status: AC
  Administered 2015-08-26: 15 mg via INTRAMUSCULAR
  Filled 2015-08-26: qty 1

## 2015-08-26 NOTE — ED Triage Notes (Signed)
Left back and shoulder pain x 3 1/2 weeks pain rads to chest, went to beach and could not swim, states feels like  It did before she had surgery on her back

## 2015-08-26 NOTE — ED Notes (Signed)
Pt. Left because anxious and tired of waiting for discharge PA aware

## 2015-08-26 NOTE — ED Notes (Signed)
Pt. Left without being discharged. Per PA we were waiting to see of the toradol was effective before sending her home with a script. She was not up for discharge yet, no papers printed yet. Pt. appeared stable, but in a hurry to get home and take her medications she didn't take this AM. Vitals stable before she left. Hedges PA, aware

## 2015-08-26 NOTE — ED Notes (Signed)
Gave pt Ginger Ale, per PA - Hedges.

## 2015-08-26 NOTE — ED Provider Notes (Signed)
Elizabeth Steele Provider Note   CSN: [id number] Arrival date & time: 08/26/15  1130   History   Chief Complaint Chief Complaint  Patient presents with  . Back Pain    HPI Elizabeth Steele is a 50 y.o. female.  HPI   50 year old female presents today with complaints of back pain. Patient reports intermittent history of back pain, notes that over the last 3 weeks she had slowly progressing pain. She reports dislocated on her upper back in between her scapula extending down into her arm. She notes that she occasionally has radiation of symptoms down into her left arm, worse when she moves her head side-to-side. She reports that she has decreased sensation in the arm, but is not having any presently. She denies any loss of strength of the upper extremities. She denies any fever, headaches, weight loss, IV drug use. Patient notes she was seen last year for similar presentation was scribed muscle relaxers and anti-inflammatories would seem to improve her symptoms. Patient denies any trauma, no loss of distal sensation strength or motor function.  Patient also reports that her blood pressure is high today as she did not take her blood pressure medication this morning. She denies any concerning signs or symptoms for end organ damage.  Past Medical History:  Diagnosis Date  . Abnormal Pap smear   . Anemia   . Arthritis   . Boil   . Diabetes mellitus without complication ([place])   . Fibroids   . Goiter   . Hypertension    takes hctz  . MRSA (methicillin resistant Staphylococcus aureus)    2005  . Ovarian cyst   . PID (pelvic inflammatory disease)   . Thyroid disease    has a goiter, doesn't need medical management  . Urinary tract infection     Patient Active Problem List   Diagnosis Date Noted  . Fibroid uterus 12/11/2010  . Abnormal uterine bleeding 12/11/2010  . HTN (hypertension) 12/11/2010    Past Surgical History:  Procedure Laterality Date  . ABDOMINAL HYSTERECTOMY      . COLPOSCOPY    . CYSTOSCOPY  01/22/2011   Procedure: CYSTOSCOPY;  Surgeon: Myra C. Hulan Fray, MD;  Location: [facility];  Service: Gynecology;  Laterality: N/A;  . DILATION AND CURETTAGE OF UTERUS    . INDUCED ABORTION     x 3  . KNEE ARTHROSCOPY    . SALIVARY STONE REMOVAL     gland removed right    OB History    Gravida Para Term Preterm AB Living   5 2 1 1 3 1    SAB TAB Ectopic Multiple Live Births     3     1       Home Medications    Prior to Admission medications   Medication Sig Start Date End Date Taking? Authorizing Provider  amLODipine (NORVASC) 5 MG tablet Take 2 tablets (10 mg total) by mouth daily. 11/16/13   Harvie Heck, PA-C  BAYER MICROLET LANCETS lancets Use as instructed 10/14/12   Luvenia Redden, PA-C  glucose blood (BAYER CONTOUR NEXT TEST) test strip Use as instructed 10/14/12   Luvenia Redden, PA-C  hydrochlorothiazide (HYDRODIURIL) 25 MG tablet Take 1 tablet (25 mg total) by mouth daily. 11/16/13   Harvie Heck, PA-C  HYDROcodone-acetaminophen (NORCO/VICODIN) 5-325 MG per tablet Take 1 tablet by mouth every 6 (six) hours as needed for moderate pain or severe pain. 11/16/13   Harvie Heck, PA-C  meclizine (ANTIVERT) 50 MG tablet  Take 1 tablet (50 mg total) by mouth 3 (three) times daily as needed. 11/16/13   Harvie Heck, PA-C  metFORMIN (GLUCOPHAGE) 500 MG tablet Take 1 tablet (500 mg total) by mouth 2 (two) times daily with a meal. 06/01/12   Blanchie Dessert, MD  Multiple Vitamins-Minerals (MULTIVITAMIN WITH MINERALS) tablet Take 1 tablet by mouth daily.    Historical Provider, MD    Family History Family History  Problem Relation Age of Onset  . Cancer Father     throat  . Cancer Brother     leukemia  . Cancer Daughter     breast  . Diabetes Mother   . Diabetes Paternal Aunt   . Thyroid disease Maternal Grandmother   . Anesthesia problems Neg Hx     Social History Social History  Substance Use Topics  . Smoking status: Current Every Day Smoker     Packs/day: 0.50    Years: 30.00    Types: Cigarettes  . Smokeless tobacco: Never Used  . Alcohol use No     Allergies   Shellfish allergy   Review of Systems Review of Systems  All other systems reviewed and are negative.    Physical Exam Updated Vital Signs BP (!) 180/123   Pulse 67   Temp 98.6 F (37 C) (Oral)   Resp 18   LMP 11/18/2010   SpO2 100%   Physical Exam  Constitutional: She is oriented to person, place, and time. She appears well-developed and well-nourished. No distress.  HENT:  Head: Normocephalic.  Neck: Normal range of motion. Neck supple.  Pulmonary/Chest: Effort normal.  Musculoskeletal: Normal range of motion. She exhibits tenderness. She exhibits no edema.  No C, T, or L spine tenderness to palpation. No obvious signs of trauma, deformity, infection, step-offs. Lung expansion normal. No scoliosis or kyphosis. Bilateral lower extremity strength 5 out of 5, sensation grossly intact. Exquisite tenderness to palpation of the lower cervical and upper thoracic. Vertebral muscles, and scapular regions. Full active range of motion of the upper extremity. Pain is worse with left lateral flexion of the neck. Worse with axial loading.   Family relates without difficulty  Neurological: She is alert and oriented to person, place, and time.  Skin: Skin is warm and dry. She is not diaphoretic.  Psychiatric: She has a normal mood and affect. Her behavior is normal. Judgment and thought content normal.  Nursing note and vitals reviewed.    ED Treatments / Results  Labs (all labs ordered are listed, but only abnormal results are displayed) Labs Reviewed - No data to display  EKG  EKG Interpretation None       Radiology No results found.  Procedures Procedures (including critical care time)  Medications Ordered in ED Medications  ketorolac (TORADOL) 15 MG/ML injection 15 mg (15 mg Intramuscular Given 08/26/15 1358)     Initial Impression /  Assessment and Plan / ED Course  I have reviewed the triage vital signs and the nursing notes.  Pertinent labs & imaging results that were available during my care of the patient were reviewed by me and considered in my medical decision making (see chart for details).  Clinical Course     Final Clinical Impressions(s) / ED Diagnoses   Final diagnoses:  Cervical radiculopathy  Bilateral thoracic back pain    Labs:   Imaging:  Consults:  Therapeutics:  Discharge Meds:   Assessment/Plan:  50 year old female presents today with back and neck pain, radiculopathy. Patient has no neurological deficits  on my exam but reports these have been happening. Patient has no infectious etiology, no need for emergent imaging here in the ED. Patient will need neurosurgical follow-up. Patient requesting Toradol here in the ED. She will be given a dose of Toradol, reassessed and discharged home with surgical follow-up, patient verbalized understanding and agreement today's plan had no further questions or concerns  Prior to reassessment after Toradol patient left the emergency room. I was unable to give her Flexeril, naproxen, or n neurosurgical follow-up information. Patient understood today's plan and need for surgical follow-up.        New Prescriptions Discharge Medication List as of 08/26/2015  2:36 PM       Okey Regal, PA-C 08/26/15 [place], MD 08/27/15 [phone]

## 2015-08-26 NOTE — ED Notes (Signed)
Pain is right under the scapula and intensifies with movement. Nothing unusual noted when palpated or visualized

## 2015-08-26 NOTE — ED Notes (Signed)
Pt. Anxious to leave and complaining of wnating to go home and take her medications.  Per PA, we were waiting to find out the effects of toradol before discharge

## 2015-08-28 ENCOUNTER — Encounter (HOSPITAL_COMMUNITY): Payer: Self-pay | Admitting: Emergency Medicine

## 2015-08-28 ENCOUNTER — Emergency Department (HOSPITAL_COMMUNITY): Payer: Self-pay

## 2015-08-28 ENCOUNTER — Other Ambulatory Visit: Payer: Self-pay

## 2015-08-28 ENCOUNTER — Emergency Department (HOSPITAL_COMMUNITY)
Admission: EM | Admit: 2015-08-28 | Discharge: 2015-08-28 | Disposition: A | Discharge status: Home / Self Care | Payer: Self-pay | Attending: Emergency Medicine | Admitting: Emergency Medicine

## 2015-08-28 DIAGNOSIS — Z7984 Long term (current) use of oral hypoglycemic drugs: Secondary | ICD-10-CM | POA: Insufficient documentation

## 2015-08-28 DIAGNOSIS — M549 Dorsalgia, unspecified: Secondary | ICD-10-CM | POA: Insufficient documentation

## 2015-08-28 DIAGNOSIS — Z79899 Other long term (current) drug therapy: Secondary | ICD-10-CM | POA: Insufficient documentation

## 2015-08-28 DIAGNOSIS — F17210 Nicotine dependence, cigarettes, uncomplicated: Secondary | ICD-10-CM | POA: Insufficient documentation

## 2015-08-28 DIAGNOSIS — I10 Essential (primary) hypertension: Secondary | ICD-10-CM | POA: Insufficient documentation

## 2015-08-28 DIAGNOSIS — Z791 Long term (current) use of non-steroidal anti-inflammatories (NSAID): Secondary | ICD-10-CM | POA: Insufficient documentation

## 2015-08-28 DIAGNOSIS — E119 Type 2 diabetes mellitus without complications: Secondary | ICD-10-CM | POA: Insufficient documentation

## 2015-08-28 LAB — CBG MONITORING, ED: Glucose-Capillary: 173 mg/dL — ABNORMAL HIGH (ref 65–99)

## 2015-08-28 MED ORDER — HYDROMORPHONE HCL 1 MG/ML IJ SOLN
1.0000 mg | Freq: Once | INTRAMUSCULAR | Status: AC
  Administered 2015-08-28: 1 mg via INTRAMUSCULAR
  Filled 2015-08-28: qty 1

## 2015-08-28 MED ORDER — ONDANSETRON 4 MG PO TBDP
4.0000 mg | ORAL_TABLET | Freq: Once | ORAL | Status: AC
  Administered 2015-08-28: 4 mg via ORAL
  Filled 2015-08-28: qty 1

## 2015-08-28 MED ORDER — TRAMADOL HCL 50 MG PO TABS
50.0000 mg | ORAL_TABLET | Freq: Four times a day (QID) | ORAL | 0 refills | Status: DC | PRN
Start: 2015-08-28 — End: 2017-04-08

## 2015-08-28 NOTE — ED Triage Notes (Addendum)
Pt c/o right upper back pain medial to left scapula x 3 weeks, hypertension, and tingling to left hand. Pain is reproducible with palpation. Associated symptoms: headache x 4 days, seeing black/gray spots onset on waking this morning, last time patient was aware of not seeing spots was last night on going to bed, and dizziness onset today at 0900. Patient now adds back pain radiates into left chest.

## 2015-08-28 NOTE — Discharge Instructions (Signed)
Follow up with your md next week for recheck and bp check

## 2015-08-28 NOTE — ED Provider Notes (Signed)
[facility] DEPT Provider Note   CSN: [id number] Arrival date & time: 08/28/15  1806     History   Chief Complaint Chief Complaint  Patient presents with  . Hypertension  . Back Pain    HPI Elizabeth Steele is a 50 y.o. female.  Patient complains of left upper back pain and mildly elevated blood pressure.   The history is provided by the patient. No language interpreter was used.  Back Pain   This is a recurrent problem. The current episode started more than 2 days ago. The problem occurs constantly. The problem has not changed since onset.The pain is associated with lifting heavy objects and twisting. The pain is present in the thoracic spine. The quality of the pain is described as aching. The pain does not radiate. The pain is at a severity of 4/10. The pain is moderate. The symptoms are aggravated by bending. The pain is the same all the time. Pertinent negatives include no chest pain, no headaches and no abdominal pain. She has tried muscle relaxants for the symptoms. The treatment provided no relief. Risk factors: Old scar from cyst removal from the left trapezius area.    Past Medical History:  Diagnosis Date  . Abnormal Pap smear   . Anemia   . Arthritis   . Boil   . Diabetes mellitus without complication ([place])   . Fibroids   . Goiter   . Hypertension    takes hctz  . MRSA (methicillin resistant Staphylococcus aureus)    2005  . Ovarian cyst   . PID (pelvic inflammatory disease)   . Thyroid disease    has a goiter, doesn't need medical management  . Urinary tract infection     Patient Active Problem List   Diagnosis Date Noted  . Fibroid uterus 12/11/2010  . Abnormal uterine bleeding 12/11/2010  . HTN (hypertension) 12/11/2010    Past Surgical History:  Procedure Laterality Date  . ABDOMINAL HYSTERECTOMY    . COLPOSCOPY    . CYSTOSCOPY  01/22/2011   Procedure: CYSTOSCOPY;  Surgeon: Myra C. Hulan Fray, MD;  Location: [facility];  Service: Gynecology;   Laterality: N/A;  . DILATION AND CURETTAGE OF UTERUS    . INDUCED ABORTION     x 3  . KNEE ARTHROSCOPY    . SALIVARY STONE REMOVAL     gland removed right    OB History    Gravida Para Term Preterm AB Living   5 2 1 1 3 1    SAB TAB Ectopic Multiple Live Births     3     1       Home Medications    Prior to Admission medications   Medication Sig Start Date End Date Taking? Authorizing Provider  amLODipine (NORVASC) 5 MG tablet Take 2 tablets (10 mg total) by mouth daily. 11/16/13   Harvie Heck, PA-C  BAYER MICROLET LANCETS lancets Use as instructed 10/14/12   Luvenia Redden, PA-C  glucose blood (BAYER CONTOUR NEXT TEST) test strip Use as instructed 10/14/12   Luvenia Redden, PA-C  hydrochlorothiazide (HYDRODIURIL) 25 MG tablet Take 1 tablet (25 mg total) by mouth daily. 11/16/13   Harvie Heck, PA-C  HYDROcodone-acetaminophen (NORCO/VICODIN) 5-325 MG per tablet Take 1 tablet by mouth every 6 (six) hours as needed for moderate pain or severe pain. 11/16/13   Harvie Heck, PA-C  meclizine (ANTIVERT) 50 MG tablet Take 1 tablet (50 mg total) by mouth 3 (three) times daily as needed. 11/16/13  Harvie Heck, PA-C  metFORMIN (GLUCOPHAGE) 500 MG tablet Take 1 tablet (500 mg total) by mouth 2 (two) times daily with a meal. 06/01/12   Blanchie Dessert, MD  Multiple Vitamins-Minerals (MULTIVITAMIN WITH MINERALS) tablet Take 1 tablet by mouth daily.    Historical Provider, MD  traMADol (ULTRAM) 50 MG tablet Take 1 tablet (50 mg total) by mouth every 6 (six) hours as needed. 08/28/15   Milton Ferguson, MD    Family History Family History  Problem Relation Age of Onset  . Cancer Father     throat  . Cancer Brother     leukemia  . Cancer Daughter     breast  . Diabetes Mother   . Diabetes Paternal Aunt   . Thyroid disease Maternal Grandmother   . Anesthesia problems Neg Hx     Social History Social History  Substance Use Topics  . Smoking status: Current Every Day Smoker    Packs/day:  0.50    Years: 30.00    Types: Cigarettes  . Smokeless tobacco: Never Used  . Alcohol use No     Allergies   Shellfish allergy   Review of Systems Review of Systems  Constitutional: Negative for appetite change and fatigue.  HENT: Negative for congestion, ear discharge and sinus pressure.   Eyes: Negative for discharge.  Respiratory: Negative for cough.   Cardiovascular: Negative for chest pain.  Gastrointestinal: Negative for abdominal pain and diarrhea.  Genitourinary: Negative for frequency and hematuria.  Musculoskeletal: Positive for back pain.  Skin: Negative for rash.  Neurological: Negative for seizures and headaches.  Psychiatric/Behavioral: Negative for hallucinations.     Physical Exam Updated Vital Signs BP 143/99 (BP Location: Right Arm)   Pulse 87   Resp 17   LMP 11/18/2010   SpO2 97%   Physical Exam  Constitutional: She is oriented to person, place, and time. She appears well-developed.  HENT:  Head: Normocephalic.  Eyes: Conjunctivae and EOM are normal. No scleral icterus.  Neck: Neck supple. No thyromegaly present.  Cardiovascular: Normal rate and regular rhythm.  Exam reveals no gallop and no friction rub.   No murmur heard. Pulmonary/Chest: No stridor. She has no wheezes. She has no rales. She exhibits no tenderness.  Abdominal: She exhibits no distension. There is no tenderness. There is no rebound.  Musculoskeletal: She exhibits no edema.  Patient has tenderness over her left upper trapezius muscle. There is also a small scar from a cyst removal years ago.  Lymphadenopathy:    She has no cervical adenopathy.  Neurological: She is oriented to person, place, and time. She exhibits normal muscle tone. Coordination normal.  Skin: No rash noted. No erythema.  Psychiatric: She has a normal mood and affect. Her behavior is normal.     ED Treatments / Results  Labs (all labs ordered are listed, but only abnormal results are displayed) Labs  Reviewed  CBG MONITORING, ED - Abnormal; Notable for the following:       Result Value   Glucose-Capillary 173 (*)    All other components within normal limits    EKG  EKG Interpretation None       Radiology Dg Chest Port 1 View  Result Date: 08/28/2015 CLINICAL DATA:  Right upper back pain. EXAM: PORTABLE CHEST 1 VIEW COMPARISON:  07/10/2014 FINDINGS: Both lungs are clear. Negative for a pneumothorax. Heart and mediastinum are within normal limits. Trachea is midline. No acute bone abnormality. IMPRESSION: No active disease. Electronically Signed   By: Quita Skye  Anselm Pancoast M.D.   On: 08/28/2015 19:31    Procedures Procedures (including critical care time)  Medications Ordered in ED Medications  HYDROmorphone (DILAUDID) injection 1 mg (1 mg Intramuscular Given 08/28/15 2001)  ondansetron (ZOFRAN-ODT) disintegrating tablet 4 mg (4 mg Oral Given 08/28/15 2001)     Initial Impression / Assessment and Plan / ED Course  I have reviewed the triage vital signs and the nursing notes.  Pertinent labs & imaging results that were available during my care of the patient were reviewed by me and considered in my medical decision making (see chart for details).  Clinical Course    Musculoskeletal pain in left upper trapezius muscle. Patient given Ultram and will follow-up with PCP next week. Also blood pressure is mildly elevated last one was 143/99 she is on 1 blood pressure medicine now and will follow-up with PCP next week  Final Clinical Impressions(s) / ED Diagnoses   Final diagnoses:  Upper back pain on left side    New Prescriptions New Prescriptions   TRAMADOL (ULTRAM) 50 MG TABLET    Take 1 tablet (50 mg total) by mouth every 6 (six) hours as needed.     Milton Ferguson, MD 08/28/15 2011

## 2015-12-22 ENCOUNTER — Other Ambulatory Visit: Payer: Self-pay | Admitting: Specialist

## 2015-12-22 DIAGNOSIS — Z1231 Encounter for screening mammogram for malignant neoplasm of breast: Secondary | ICD-10-CM

## 2016-01-12 HISTORY — PX: OTHER SURGICAL HISTORY: SHX169

## 2016-01-19 ENCOUNTER — Ambulatory Visit
Admission: RE | Admit: 2016-01-19 | Discharge: 2016-01-19 | Disposition: A | Discharge status: Home / Self Care | Payer: BLUE CROSS/BLUE SHIELD | Source: Ambulatory Visit | Attending: Specialist | Admitting: Specialist

## 2016-01-19 DIAGNOSIS — Z1231 Encounter for screening mammogram for malignant neoplasm of breast: Secondary | ICD-10-CM

## 2016-02-23 ENCOUNTER — Ambulatory Visit: Payer: BLUE CROSS/BLUE SHIELD | Admitting: Diagnostic Neuroimaging

## 2016-03-15 ENCOUNTER — Ambulatory Visit: Payer: BLUE CROSS/BLUE SHIELD | Admitting: Diagnostic Neuroimaging

## 2016-12-12 ENCOUNTER — Other Ambulatory Visit: Payer: Self-pay

## 2016-12-12 ENCOUNTER — Emergency Department (HOSPITAL_COMMUNITY)
Admission: EM | Admit: 2016-12-12 | Discharge: 2016-12-12 | Disposition: A | Discharge status: Home / Self Care | Payer: BLUE CROSS/BLUE SHIELD | Attending: Emergency Medicine | Admitting: Emergency Medicine

## 2016-12-12 ENCOUNTER — Encounter (HOSPITAL_COMMUNITY): Payer: Self-pay | Admitting: *Deleted

## 2016-12-12 DIAGNOSIS — S0083XA Contusion of other part of head, initial encounter: Secondary | ICD-10-CM | POA: Insufficient documentation

## 2016-12-12 DIAGNOSIS — Y9389 Activity, other specified: Secondary | ICD-10-CM | POA: Insufficient documentation

## 2016-12-12 DIAGNOSIS — F17210 Nicotine dependence, cigarettes, uncomplicated: Secondary | ICD-10-CM | POA: Diagnosis not present

## 2016-12-12 DIAGNOSIS — E119 Type 2 diabetes mellitus without complications: Secondary | ICD-10-CM | POA: Insufficient documentation

## 2016-12-12 DIAGNOSIS — I10 Essential (primary) hypertension: Secondary | ICD-10-CM | POA: Diagnosis not present

## 2016-12-12 DIAGNOSIS — Y999 Unspecified external cause status: Secondary | ICD-10-CM | POA: Insufficient documentation

## 2016-12-12 DIAGNOSIS — S0993XA Unspecified injury of face, initial encounter: Secondary | ICD-10-CM | POA: Diagnosis present

## 2016-12-12 DIAGNOSIS — W500XXA Accidental hit or strike by another person, initial encounter: Secondary | ICD-10-CM | POA: Insufficient documentation

## 2016-12-12 DIAGNOSIS — Y929 Unspecified place or not applicable: Secondary | ICD-10-CM | POA: Insufficient documentation
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
## 2017-03-02 ENCOUNTER — Other Ambulatory Visit: Payer: Self-pay

## 2017-03-02 ENCOUNTER — Inpatient Hospital Stay (HOSPITAL_COMMUNITY)
Admission: AD | Admit: 2017-03-02 | Discharge: 2017-03-02 | Disposition: A | Discharge status: Home / Self Care | Payer: BLUE CROSS/BLUE SHIELD | Source: Ambulatory Visit | Attending: Obstetrics & Gynecology | Admitting: Obstetrics & Gynecology

## 2017-03-02 DIAGNOSIS — N390 Urinary tract infection, site not specified: Secondary | ICD-10-CM | POA: Insufficient documentation

## 2017-03-02 DIAGNOSIS — N3000 Acute cystitis without hematuria: Secondary | ICD-10-CM | POA: Diagnosis not present

## 2017-03-02 DIAGNOSIS — E119 Type 2 diabetes mellitus without complications: Secondary | ICD-10-CM | POA: Diagnosis not present

## 2017-03-02 DIAGNOSIS — I10 Essential (primary) hypertension: Secondary | ICD-10-CM | POA: Insufficient documentation

## 2017-03-02 DIAGNOSIS — F17210 Nicotine dependence, cigarettes, uncomplicated: Secondary | ICD-10-CM | POA: Insufficient documentation

## 2017-03-02 DIAGNOSIS — R300 Dysuria: Secondary | ICD-10-CM | POA: Diagnosis present

## 2017-03-02 LAB — URINALYSIS, ROUTINE W REFLEX MICROSCOPIC
Bilirubin Urine: NEGATIVE
Glucose, UA: NEGATIVE mg/dL
HGB URINE DIPSTICK: NEGATIVE
KETONES UR: NEGATIVE mg/dL
Nitrite: POSITIVE — AB
PROTEIN: NEGATIVE mg/dL
Specific Gravity, Urine: 1.015 (ref 1.005–1.030)
pH: 5 (ref 5.0–8.0)

## 2017-03-02 MED ORDER — SULFAMETHOXAZOLE-TRIMETHOPRIM 800-160 MG PO TABS: 1.0000 | ORAL_TABLET | Freq: Two times a day (BID) | ORAL | 0 refills | Status: DC

## 2017-03-02 MED ORDER — PHENAZOPYRIDINE HCL 200 MG PO TABS: 200.0000 mg | ORAL_TABLET | Freq: Three times a day (TID) | ORAL | 0 refills | Status: DC

## 2017-03-02 NOTE — MAU Note (Signed)
Took AZO, trying home remedies, no relief.  Frequency, urgency and pain with urination first started 3 days ago. No fever.

## 2017-03-02 NOTE — MAU Provider Note (Signed)
History     CSN: [id number]  Arrival date and time: 03/02/17 1418   First Provider Initiated Contact with Patient 03/02/17 1504      Chief Complaint  Patient presents with  . Dysuria   HPI [name] is a 52 y.o. [phone] who presents to MAU today with complaint of 3 days of worsening UTI symptoms. She denies hematuria, fever or flank pain. She has been taking AZO tabs with minimal relief. She has had issues with recurrent UTIs since she was a teenager.   OB History    Gravida Para Term Preterm AB Living   5 2 1 1 3 1    SAB TAB Ectopic Multiple Live Births     3     1      Past Medical History:  Diagnosis Date  . Abnormal Pap smear   . Anemia   . Arthritis   . Boil   . Diabetes mellitus without complication ([place])   . Fibroids   . Goiter   . Hypertension    takes hctz  . MRSA (methicillin resistant Staphylococcus aureus)    2005  . Ovarian cyst   . PID (pelvic inflammatory disease)   . Thyroid disease    has a goiter, doesn't need medical management  . Urinary tract infection     Past Surgical History:  Procedure Laterality Date  . ABDOMINAL HYSTERECTOMY    . COLPOSCOPY    . CYSTOSCOPY  01/22/2011   Procedure: CYSTOSCOPY;  Surgeon: Myra C. Hulan Fray, MD;  Location: [facility];  Service: Gynecology;  Laterality: N/A;  . DILATION AND CURETTAGE OF UTERUS    . INDUCED ABORTION     x 3  . KNEE ARTHROSCOPY    . SALIVARY STONE REMOVAL     gland removed right    Family History  Problem Relation Age of Onset  . Cancer Father        throat  . Cancer Brother        leukemia  . Cancer Daughter        breast  . Diabetes Mother   . Diabetes Paternal Aunt   . Thyroid disease Maternal Grandmother   . Anesthesia problems Neg Hx     Social History   Tobacco Use  . Smoking status: Current Every Day Smoker    Packs/day: 0.50    Years: 30.00    Pack years: 15.00    Types: Cigarettes  . Smokeless tobacco: Never Used  Substance Use Topics  . Alcohol use: No  .  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 Shellfish Allergy Anaphylaxis    No medications prior to admission.    Review of Systems  Constitutional: Negative for fever.  Gastrointestinal: Negative for abdominal pain, constipation, diarrhea, nausea and vomiting.  Genitourinary: Positive for dysuria. Negative for flank pain, frequency, hematuria, urgency, vaginal bleeding and vaginal discharge.   Physical Exam   Blood pressure 121/74, pulse 97, temperature 98.1 F (36.7 C), temperature source Oral, resp. rate 16, weight 205 lb 4 oz (93.1 kg), last menstrual period 11/18/2010, SpO2 97 %.  Physical Exam  Nursing note and vitals reviewed. Constitutional: She is oriented to person, place, and time. She appears well-developed and well-nourished. No distress.  HENT:  Head: Normocephalic and atraumatic.  Cardiovascular: Normal rate.  Respiratory: Effort normal.  GI: Soft. She exhibits no distension and no mass. There is  no tenderness. There is no rebound, no guarding and no CVA tenderness.  Neurological: She is alert and oriented to person, place, and time.  Skin: Skin is warm and dry. No erythema.  Psychiatric: She has a normal mood and affect.    Results for orders placed or performed during the hospital encounter of 03/02/17 (from the past 24 hour(s))  Urinalysis, Routine w reflex microscopic     Status: Abnormal   Collection Time: 03/02/17  2:20 PM  Result Value Ref Range   Color, Urine AMBER (A) YELLOW   APPearance HAZY (A) CLEAR   Specific Gravity, Urine 1.015 1.005 - 1.030   pH 5.0 5.0 - 8.0   Glucose, UA NEGATIVE NEGATIVE mg/dL   Hgb urine dipstick NEGATIVE NEGATIVE   Bilirubin Urine NEGATIVE NEGATIVE   Ketones, ur NEGATIVE NEGATIVE mg/dL   Protein, ur NEGATIVE NEGATIVE mg/dL   Nitrite POSITIVE (A) NEGATIVE   Leukocytes, UA TRACE (A) NEGATIVE   RBC / HPF 6-30 0 - 5 RBC/hpf   WBC, UA TOO NUMEROUS TO COUNT 0 - 5  WBC/hpf   Bacteria, UA RARE (A) NONE SEEN   Squamous Epithelial / LPF 6-30 (A) NONE SEEN   WBC Clumps PRESENT    Mucus PRESENT     MAU Course  Procedures None  MDM UA today indicated UTI Urine culture ordered  Assessment and Plan  A: UTI, acute, uncomplicated  P: Discharge home Rx for Bactrim and Pyridium sent to patient's pharmacy of choice  Warning signs for pyelonephritis discussed Patient advised to follow-up with PCP if symptoms persist or worsen Patient may return to MAU as needed or if her condition were to change or worsen, however since this is not pregnancy related advised to go to Urgent Care or [facility] or [facility] for further evaluation if needed  Kerry Hough, PA-C 03/02/2017, 3:58 PM

## 2017-03-02 NOTE — Discharge Instructions (Signed)

## 2017-03-04 LAB — URINE CULTURE: Culture: 80000 — AB

## 2017-03-16 ENCOUNTER — Encounter: Payer: Self-pay | Admitting: Gastroenterology

## 2017-04-08 ENCOUNTER — Other Ambulatory Visit: Payer: Self-pay

## 2017-04-08 ENCOUNTER — Ambulatory Visit (AMBULATORY_SURGERY_CENTER): Payer: Self-pay

## 2017-04-08 VITALS — Ht 65.0 in | Wt 201.0 lb

## 2017-04-08 DIAGNOSIS — Z1211 Encounter for screening for malignant neoplasm of colon: Secondary | ICD-10-CM

## 2017-04-08 MED ORDER — PEG-KCL-NACL-NASULF-NA ASC-C 140 G PO SOLR: 1.0000 | Freq: Once | ORAL | 0 refills | Status: AC

## 2017-04-08 NOTE — Progress Notes (Signed)
Denies allergies to eggs or soy products. Denies complication of anesthesia or sedation. Denies use of weight loss medication. Denies use of O2.   Emmi instructions declined.  

## 2017-04-12 ENCOUNTER — Encounter: Payer: Self-pay | Admitting: Gastroenterology

## 2017-04-14 ENCOUNTER — Telehealth: Payer: Self-pay | Admitting: Gastroenterology

## 2017-04-14 NOTE — Telephone Encounter (Signed)
Patient states insurance requires prior auth on prep for colon with Dr.Nandigam on 4.11.19. Pt states she can not pay $100 dollars and wants to know what to do. Pt had ov on 3.29.19.

## 2017-04-14 NOTE — Telephone Encounter (Signed)
Returned patient's call and she states that her pharmacy told her the prep will be $100 and would need a prior auth.  I explained that we do not do prior authorizations for the prep.  I asked her if she received the universal coupon for the Plenvu and she said yes.  She has not presented it to the pharmacy.  I advised her that it would not cost her any more than $50 with the coupon and she insisted she did not know she had to pay for the prep.  She stated she could not afford to pay anything for it.  I asked if she could come and pick up a sample and she said she could on her way to work.  Sample left at front desk on 4 th floor.    Plenvu  Lot [id number] Exp.  06/2018  1 kit given.  B.Elizabeth Steele, CMA  PV

## 2017-04-21 ENCOUNTER — Encounter: Payer: Self-pay | Admitting: Gastroenterology

## 2017-04-21 ENCOUNTER — Other Ambulatory Visit: Payer: Self-pay

## 2017-04-21 ENCOUNTER — Ambulatory Visit (AMBULATORY_SURGERY_CENTER): Payer: BLUE CROSS/BLUE SHIELD | Admitting: Gastroenterology

## 2017-04-21 VITALS — BP 143/93 | HR 75 | Temp 97.8°F | Resp 16 | Ht 65.0 in | Wt 201.0 lb

## 2017-04-21 DIAGNOSIS — D125 Benign neoplasm of sigmoid colon: Secondary | ICD-10-CM

## 2017-04-21 DIAGNOSIS — Z1211 Encounter for screening for malignant neoplasm of colon: Secondary | ICD-10-CM | POA: Diagnosis present

## 2017-04-21 DIAGNOSIS — K635 Polyp of colon: Secondary | ICD-10-CM | POA: Diagnosis not present

## 2017-04-21 DIAGNOSIS — D129 Benign neoplasm of anus and anal canal: Secondary | ICD-10-CM

## 2017-04-21 DIAGNOSIS — Z1212 Encounter for screening for malignant neoplasm of rectum: Secondary | ICD-10-CM

## 2017-04-21 DIAGNOSIS — D128 Benign neoplasm of rectum: Secondary | ICD-10-CM

## 2017-04-21 DIAGNOSIS — K621 Rectal polyp: Secondary | ICD-10-CM | POA: Diagnosis not present

## 2017-04-21 MED ORDER — SODIUM CHLORIDE 0.9 % IV SOLN: 500.0000 mL | Freq: Once | INTRAVENOUS | Status: DC

## 2017-04-21 NOTE — Op Note (Signed)
[facility] Patient Name: Elizabeth Steele Procedure Date: 04/21/2017 8:44 AM MRN: [id number] Endoscopist: Mauri Pole , MD Age: 52 Referring MD:  Date of Birth: Jun 13, 1965 Gender: Female Account #: [id number] Procedure:                Colonoscopy Indications:              Screening for colorectal malignant neoplasm, This                            is the patient's first colonoscopy Medicines:                Monitored Anesthesia Care Procedure:                Pre-Anesthesia Assessment:                           - Prior to the procedure, a History and Physical                            was performed, and patient medications and                            allergies were reviewed. The patient's tolerance of                            previous anesthesia was also reviewed. The risks                            and benefits of the procedure and the sedation                            options and risks were discussed with the patient.                            All questions were answered, and informed consent                            was obtained. Prior Anticoagulants: The patient has                            taken no previous anticoagulant or antiplatelet                            agents. ASA Grade Assessment: III - A patient with                            severe systemic disease. After reviewing the risks                            and benefits, the patient was deemed in                            satisfactory condition to undergo the procedure.  After obtaining informed consent, the colonoscope                            was passed under direct vision. Throughout the                            procedure, the patient's blood pressure, pulse, and                            oxygen saturations were monitored continuously. The                            Colonoscope was introduced through the anus and                            advanced to the the  cecum, identified by                            appendiceal orifice and ileocecal valve. The                            colonoscopy was performed without difficulty. The                            patient tolerated the procedure well. The quality                            of the bowel preparation was excellent. The                            ileocecal valve, appendiceal orifice, and rectum                            were photographed. Scope In: 8:48:33 AM Scope Out: 9:05:20 AM Scope Withdrawal Time: 0 hours 10 minutes 7 seconds  Total Procedure Duration: 0 hours 16 minutes 47 seconds  Findings:                 The perianal and digital rectal examinations were                            normal.                           Three sessile polyps were found in the                            recto-sigmoid colon and sigmoid colon. The polyps                            were 4 to 9 mm in size. These polyps were removed                            with a cold snare. Resection and retrieval were  complete.                           Scattered small and large-mouthed diverticula were                            found in the sigmoid colon, descending colon,                            transverse colon and ascending colon.                           Non-bleeding internal hemorrhoids were found during                            retroflexion. The hemorrhoids were medium-sized. Complications:            No immediate complications. Estimated Blood Loss:     Estimated blood loss was minimal. Impression:               - Three 4 to 9 mm polyps at the recto-sigmoid colon                            and in the sigmoid colon, removed with a cold                            snare. Resected and retrieved.                           - Mild diverticulosis in the sigmoid colon, in the                            descending colon, in the transverse colon and in                            the  ascending colon.                           - Non-bleeding internal hemorrhoids. Recommendation:           - Patient has a contact number available for                            emergencies. The signs and symptoms of potential                            delayed complications were discussed with the                            patient. Return to normal activities tomorrow.                            Written discharge instructions were provided to the                            patient.                           -  Resume previous diet.                           - Continue present medications.                           - Await pathology results.                           - Repeat colonoscopy in 3 years for surveillance                            based on pathology results. Mauri Pole, MD 04/21/2017 9:09:29 AM This report has been signed electronically.

## 2017-04-21 NOTE — Patient Instructions (Signed)
YOU HAD AN ENDOSCOPIC PROCEDURE TODAY AT [facility]:   Refer to the procedure report that was given to you for any specific questions about what was found during the examination.  If the procedure report does not answer your questions, please call your gastroenterologist to clarify.  If you requested that your care partner not be given the details of your procedure findings, then the procedure report has been included in a sealed envelope for you to review at your convenience later.  YOU SHOULD EXPECT: Some feelings of bloating in the abdomen. Passage of more gas than usual.  Walking can help get rid of the air that was put into your GI tract during the procedure and reduce the bloating. If you had a lower endoscopy (such as a colonoscopy or flexible sigmoidoscopy) you may notice spotting of blood in your stool or on the toilet paper. If you underwent a bowel prep for your procedure, you may not have a normal bowel movement for a few days.  Please Note:  You might notice some irritation and congestion in your nose or some drainage.  This is from the oxygen used during your procedure.  There is no need for concern and it should clear up in a day or so.  SYMPTOMS TO REPORT IMMEDIATELY:   Following lower endoscopy (colonoscopy or flexible sigmoidoscopy):  Excessive amounts of blood in the stool  Significant tenderness or worsening of abdominal pains  Swelling of the abdomen that is new, acute  Fever of 100F or higher  Please see handouts given to you on polyps, Diverticulosis and Hemorroids.  For urgent or emergent issues, a gastroenterologist can be reached at any hour by calling [phone].   DIET:  We do recommend a small meal at first, but then you may proceed to your regular diet.  Drink plenty of fluids but you should avoid alcoholic beverages for 24 hours.  ACTIVITY:  You should plan to take it easy for the rest of today and you should NOT DRIVE or use heavy machinery  until tomorrow (because of the sedation medicines used during the test).    FOLLOW UP: Our staff will call the number listed on your records the next business day following your procedure to check on you and address any questions or concerns that you may have regarding the information given to you following your procedure. If we do not reach you, we will leave a message.  However, if you are feeling well and you are not experiencing any problems, there is no need to return our call.  We will assume that you have returned to your regular daily activities without incident.  If any biopsies were taken you will be contacted by phone or by letter within the next 1-3 weeks.  Please call us at [phone] if you have not heard about the biopsies in 3 weeks.    SIGNATURES/CONFIDENTIALITY: You and/or your care partner have signed paperwork which will be entered into your electronic medical record.  These signatures attest to the fact that that the information above on your After Visit Summary has been reviewed and is understood.  Full responsibility of the confidentiality of this discharge information lies with you and/or your care-partner.  Thank you for letting us take care of your healthcare needs today.

## 2017-04-21 NOTE — Progress Notes (Signed)
To PACU, vss patent aw report to rn 

## 2017-04-21 NOTE — Progress Notes (Signed)
Called to room to assist during endoscopic procedure.  Patient ID and intended procedure confirmed with present staff. Received instructions for my participation in the procedure from the performing physician.  

## 2017-04-22 ENCOUNTER — Telehealth: Payer: Self-pay | Admitting: *Deleted

## 2017-04-22 NOTE — Telephone Encounter (Signed)
  Follow up Call-  Call back number 04/21/2017  Post procedure Call Back phone  # [phone]  Permission to leave phone message Yes  Some recent data might be hidden     Patient questions:  Do you have a fever, pain , or abdominal swelling? No. Pain Score  0 *  Have you tolerated food without any problems? Yes.    Have you been able to return to your normal activities? Yes.    Do you have any questions about your discharge instructions: Diet   No. Medications  No. Follow up visit  No.  Do you have questions or concerns about your Care? No.  Actions: * If pain score is 4 or above: No action needed, pain <4.

## 2017-04-26 ENCOUNTER — Emergency Department (HOSPITAL_COMMUNITY)
Admission: EM | Admit: 2017-04-26 | Discharge: 2017-04-26 | Disposition: A | Discharge status: Home / Self Care | Payer: BLUE CROSS/BLUE SHIELD | Attending: Emergency Medicine | Admitting: Emergency Medicine

## 2017-04-26 ENCOUNTER — Encounter (HOSPITAL_COMMUNITY): Payer: Self-pay | Admitting: Emergency Medicine

## 2017-04-26 DIAGNOSIS — S40022A Contusion of left upper arm, initial encounter: Secondary | ICD-10-CM | POA: Insufficient documentation

## 2017-04-26 DIAGNOSIS — Z7984 Long term (current) use of oral hypoglycemic drugs: Secondary | ICD-10-CM | POA: Insufficient documentation

## 2017-04-26 DIAGNOSIS — Y999 Unspecified external cause status: Secondary | ICD-10-CM | POA: Insufficient documentation

## 2017-04-26 DIAGNOSIS — Y939 Activity, unspecified: Secondary | ICD-10-CM | POA: Insufficient documentation

## 2017-04-26 DIAGNOSIS — X58XXXA Exposure to other specified factors, initial encounter: Secondary | ICD-10-CM | POA: Diagnosis not present

## 2017-04-26 DIAGNOSIS — I10 Essential (primary) hypertension: Secondary | ICD-10-CM | POA: Diagnosis not present

## 2017-04-26 DIAGNOSIS — Z79899 Other long term (current) drug therapy: Secondary | ICD-10-CM | POA: Insufficient documentation

## 2017-04-26 DIAGNOSIS — Y929 Unspecified place or not applicable: Secondary | ICD-10-CM | POA: Insufficient documentation

## 2017-04-26 DIAGNOSIS — F17210 Nicotine dependence, cigarettes, uncomplicated: Secondary | ICD-10-CM | POA: Diagnosis not present

## 2017-04-26 DIAGNOSIS — E119 Type 2 diabetes mellitus without complications: Secondary | ICD-10-CM | POA: Insufficient documentation

## 2017-04-26 NOTE — ED Provider Notes (Signed)
[facility] DEPT Provider Note   CSN: [id number] Arrival date & time: 04/26/17  2376     History   Chief Complaint Chief Complaint  Patient presents with  . Arm Pain    HPI Elizabeth Steele is a 52 y.o. female.  The history is provided by the patient. No language interpreter was used.  Arm Pain  This is a new problem. Episode onset: 5 days. The problem occurs constantly. The problem has not changed since onset.Nothing aggravates the symptoms. Nothing relieves the symptoms. She has tried nothing for the symptoms. The treatment provided no relief.   Pt complains of a bruise on her arm at Iv site.  No heat, no knots.  Past Medical History:  Diagnosis Date  . Abnormal Pap smear   . Allergy   . Anemia   . Anxiety   . Arthritis   . Blood transfusion without reported diagnosis   . Boil   . Diabetes mellitus without complication ([place])   . Fibroids   . Goiter   . Hypertension    takes hctz  . MRSA (methicillin resistant Staphylococcus aureus)    2005  . Ovarian cyst   . PID (pelvic inflammatory disease)   . Substance abuse ([place])   . Thyroid disease    has a goiter, doesn't need medical management  . Urinary tract infection     Patient Active Problem List   Diagnosis Date Noted  . Fibroid uterus 12/11/2010  . Abnormal uterine bleeding 12/11/2010  . HTN (hypertension) 12/11/2010    Past Surgical History:  Procedure Laterality Date  . ABDOMINAL HYSTERECTOMY    . COLPOSCOPY    . CYSTOSCOPY  01/22/2011   Procedure: CYSTOSCOPY;  Surgeon: Myra C. Hulan Fray, MD;  Location: [facility];  Service: Gynecology;  Laterality: N/A;  . DILATION AND CURETTAGE OF UTERUS    . INDUCED ABORTION     x 3  . KNEE ARTHROSCOPY    . SALIVARY STONE REMOVAL     gland removed right     OB History    Gravida  5   Para  2   Term  1   Preterm  1   AB  3   Living  1     SAB      TAB  3   Ectopic      Multiple      Live Births  1            Home  Medications    Prior to Admission medications   Medication Sig Start Date End Date Taking? Authorizing Provider  amLODipine (NORVASC) 5 MG tablet Take 2 tablets (10 mg total) by mouth daily. 11/16/13   Harvie Heck, PA-C  BAYER MICROLET LANCETS lancets Use as instructed 10/14/12   Luvenia Redden, PA-C  cyclobenzaprine (FLEXERIL) 10 MG tablet Take 10 mg by mouth 3 (three) times daily as needed for muscle spasms.    [provider]  glucose blood (BAYER CONTOUR NEXT TEST) test strip Use as instructed 10/14/12   Luvenia Redden, PA-C  hydrochlorothiazide (HYDRODIURIL) 25 MG tablet Take 1 tablet (25 mg total) by mouth daily. 11/16/13   Harvie Heck, PA-C  HYDROcodone-acetaminophen (NORCO/VICODIN) 5-325 MG per tablet Take 1 tablet by mouth every 6 (six) hours as needed for moderate pain or severe pain. 11/16/13   Harvie Heck, PA-C  meclizine (ANTIVERT) 50 MG tablet Take 1 tablet (50 mg total) by mouth 3 (three) times daily as needed.  Patient not taking: Reported on 04/08/2017 11/16/13   Harvie Heck, PA-C  metFORMIN (GLUCOPHAGE) 500 MG tablet Take 1 tablet (500 mg total) by mouth 2 (two) times daily with a meal. 06/01/12   Blanchie Dessert, MD    Family History Family History  Problem Relation Age of Onset  . Cancer Father        throat  . Esophageal cancer Father   . Cancer Brother        leukemia  . Cancer Daughter        breast  . Diabetes Mother   . Diabetes Paternal Aunt   . Thyroid disease Maternal Grandmother   . Anesthesia problems Neg Hx   . Colon cancer Neg Hx   . Liver cancer Neg Hx   . Pancreatic cancer Neg Hx   . Rectal cancer Neg Hx   . Stomach cancer Neg Hx     Social History Social History   Tobacco Use  . Smoking status: Current Every Day Smoker    Packs/day: 0.50    Years: 30.00    Pack years: 15.00    Types: Cigarettes  . Smokeless tobacco: Never Used  Substance Use Topics  . Alcohol use: No  . Drug use: No    Types: Cocaine    Comment: Pt  has been clean 14 years     Allergies   Shellfish allergy   Review of Systems Review of Systems  All other systems reviewed and are negative.    Physical Exam Updated Vital Signs BP (!) 160/118 (BP Location: Right Arm)   Pulse 91   Temp 97.9 F (36.6 C) (Oral)   Resp 18   LMP 11/18/2010   SpO2 100%   Physical Exam  Constitutional: She appears well-developed and well-nourished.  HENT:  Head: Normocephalic.  Neck: Normal range of motion.  Cardiovascular: Normal rate.  Pulmonary/Chest: Effort normal.  Musculoskeletal:  ruised area left arm,  No knot, soft,  No erythema   Neurological: She is alert.  Skin: Skin is warm.  Psychiatric: She has a normal mood and affect.  Nursing note and vitals reviewed.    ED Treatments / Results  Labs (all labs ordered are listed, but only abnormal results are displayed) Labs Reviewed - No data to display  EKG None  Radiology No results found.  Procedures Procedures (including critical care time)  Medications Ordered in ED Medications - No data to display   Initial Impression / Assessment and Plan / ED Course  I have reviewed the triage vital signs and the nursing notes.  Pertinent labs & imaging results that were available during my care of the patient were reviewed by me and considered in my medical decision making (see chart for details).     MDM  Pt counseled on bruising and time to healing.  Final Clinical Impressions(s) / ED Diagnoses   Final diagnoses:  Hematoma of arm, left, initial encounter    ED Discharge Orders    None    An After Visit Summary was printed and given to the patient.    Fransico Meadow, [place] 04/26/17 1516    Fatima Blank, MD 04/28/17 [phone]

## 2017-04-26 NOTE — ED Notes (Signed)
Bed: WTR6 Expected date:  Expected time:  Means of arrival:  Comments: 

## 2017-04-26 NOTE — Discharge Instructions (Addendum)
Return if any problems.

## 2017-04-26 NOTE — ED Triage Notes (Signed)
Per pt, states she had a colonoscopy 5 days ago, states bruising and pain at IV site-left forearm-bruise present, no swelling

## 2017-04-27 ENCOUNTER — Encounter: Payer: Self-pay | Admitting: Gastroenterology

## 2017-07-06 ENCOUNTER — Emergency Department (HOSPITAL_COMMUNITY)
Admission: EM | Admit: 2017-07-06 | Discharge: 2017-07-06 | Disposition: A | Discharge status: Home / Self Care | Payer: BLUE CROSS/BLUE SHIELD | Attending: Emergency Medicine | Admitting: Emergency Medicine

## 2017-07-06 ENCOUNTER — Other Ambulatory Visit: Payer: Self-pay

## 2017-07-06 ENCOUNTER — Encounter (HOSPITAL_COMMUNITY): Payer: Self-pay

## 2017-07-06 DIAGNOSIS — Z79899 Other long term (current) drug therapy: Secondary | ICD-10-CM | POA: Insufficient documentation

## 2017-07-06 DIAGNOSIS — I10 Essential (primary) hypertension: Secondary | ICD-10-CM | POA: Insufficient documentation

## 2017-07-06 DIAGNOSIS — Z7984 Long term (current) use of oral hypoglycemic drugs: Secondary | ICD-10-CM | POA: Insufficient documentation

## 2017-07-06 DIAGNOSIS — F17210 Nicotine dependence, cigarettes, uncomplicated: Secondary | ICD-10-CM | POA: Insufficient documentation

## 2017-07-06 DIAGNOSIS — E119 Type 2 diabetes mellitus without complications: Secondary | ICD-10-CM | POA: Insufficient documentation

## 2017-07-06 DIAGNOSIS — F4321 Adjustment disorder with depressed mood: Secondary | ICD-10-CM | POA: Insufficient documentation

## 2017-07-06 DIAGNOSIS — R55 Syncope and collapse: Secondary | ICD-10-CM | POA: Insufficient documentation

## 2017-07-06 LAB — CBC WITH DIFFERENTIAL/PLATELET
ABS IMMATURE GRANULOCYTES: 0 10*3/uL (ref 0.0–0.1)
BASOS PCT: 0 %
Basophils Absolute: 0 10*3/uL (ref 0.0–0.1)
Eosinophils Absolute: 0.1 10*3/uL (ref 0.0–0.7)
Eosinophils Relative: 1 %
HEMATOCRIT: 45.2 % (ref 36.0–46.0)
HEMOGLOBIN: 14.8 g/dL (ref 12.0–15.0)
IMMATURE GRANULOCYTES: 0 %
LYMPHS PCT: 22 %
Lymphs Abs: 2.7 10*3/uL (ref 0.7–4.0)
MCH: 30.7 pg (ref 26.0–34.0)
MCHC: 32.7 g/dL (ref 30.0–36.0)
MCV: 93.8 fL (ref 78.0–100.0)
Monocytes Absolute: 0.5 10*3/uL (ref 0.1–1.0)
Monocytes Relative: 4 %
Neutro Abs: 9 10*3/uL — ABNORMAL HIGH (ref 1.7–7.7)
Neutrophils Relative %: 73 %
Platelets: 175 10*3/uL (ref 150–400)
RBC: 4.82 MIL/uL (ref 3.87–5.11)
RDW: 12.9 % (ref 11.5–15.5)
WBC: 12.4 10*3/uL — AB (ref 4.0–10.5)

## 2017-07-06 LAB — BASIC METABOLIC PANEL
ANION GAP: 13 (ref 5–15)
BUN: 12 mg/dL (ref 6–20)
CHLORIDE: 106 mmol/L (ref 98–111)
CO2: 22 mmol/L (ref 22–32)
Calcium: 9.7 mg/dL (ref 8.9–10.3)
Creatinine, Ser: 0.73 mg/dL (ref 0.44–1.00)
GFR calc Af Amer: >60 mL/min (ref >60)
GLUCOSE: 205 mg/dL — AB (ref 70–99)
POTASSIUM: 3.3 mmol/L — AB (ref 3.5–5.1)
Sodium: 141 mmol/L (ref 135–145)

## 2017-07-06 MED ORDER — POTASSIUM CHLORIDE CRYS ER 20 MEQ PO TBCR
40.0000 meq | EXTENDED_RELEASE_TABLET | Freq: Once | ORAL | Status: AC
  Administered 2017-07-06: 40 meq via ORAL
  Filled 2017-07-06: qty 2

## 2017-07-06 MED ORDER — SODIUM CHLORIDE 0.9 % IV BOLUS
1000.0000 mL | Freq: Once | INTRAVENOUS | Status: AC
  Administered 2017-07-06: 1000 mL via INTRAVENOUS

## 2017-07-06 MED ORDER — LORAZEPAM 1 MG PO TABS: 1.0000 mg | ORAL_TABLET | Freq: Four times a day (QID) | ORAL | 0 refills | Status: DC | PRN

## 2017-07-06 MED ORDER — LORAZEPAM 2 MG/ML IJ SOLN
1.0000 mg | Freq: Once | INTRAMUSCULAR | Status: AC
  Administered 2017-07-06: 1 mg via INTRAVENOUS
  Filled 2017-07-06: qty 1

## 2017-07-06 NOTE — ED Notes (Signed)
Dr Regenia Skeeter at bedside. Pt is crying and sad. Family at bedside.

## 2017-07-06 NOTE — ED Triage Notes (Signed)
Pt arrives to ED from home with complaints of multiple 2-3 second episodes of syncope since being told by police this morning that her son was killed. EMS reports pt did not fall or hit her head. Pt is hyperventilating upon arrival, tearful, and screaming out in agony r/t the death of her son. Pt placed in position of comfort with bed locked and lowered, call bell in reach.

## 2017-07-06 NOTE — ED Notes (Signed)
Patient verbalizes understanding of discharge instructions. Opportunity for questioning and answers were provided. Armband removed by staff, pt discharged from ED.  

## 2017-07-06 NOTE — Discharge Instructions (Addendum)
If you develop shortness of breath, chest pain, severe headache, vomiting, or any other new/concerning symptoms then return to the ER for evaluation.  Otherwise follow-up closely with your primary care physician.

## 2017-07-06 NOTE — ED Provider Notes (Signed)
[facility] EMERGENCY DEPARTMENT Provider Note   CSN: [id number] Arrival date & time: 07/06/17  1311     History   Chief Complaint Chief Complaint  Patient presents with  . Loss of Consciousness    HPI Elizabeth Steele is a 52 y.o. female.  HPI  52 year old female presents with syncope.  She has a history of diabetes and hypertension.  She just found out that her son was killed.  She is been having severe grief and crying since.  During this time she has briefly lost consciousness for a few seconds at a time twice.  Family states she is been hyperventilating.  History is otherwise limited from the patient herself given her grief but family states that the patient has otherwise been acting at her normal baseline prior to finding out this information and has not been sick.  Past Medical History:  Diagnosis Date  . Abnormal Pap smear   . Allergy   . Anemia   . Anxiety   . Arthritis   . Blood transfusion without reported diagnosis   . Boil   . Diabetes mellitus without complication ([place])   . Fibroids   . Goiter   . Hypertension    takes hctz  . MRSA (methicillin resistant Staphylococcus aureus)    2005  . Ovarian cyst   . PID (pelvic inflammatory disease)   . Substance abuse ([place])   . Thyroid disease    has a goiter, doesn't need medical management  . Urinary tract infection     Patient Active Problem List   Diagnosis Date Noted  . Fibroid uterus 12/11/2010  . Abnormal uterine bleeding 12/11/2010  . HTN (hypertension) 12/11/2010    Past Surgical History:  Procedure Laterality Date  . ABDOMINAL HYSTERECTOMY    . COLPOSCOPY    . CYSTOSCOPY  01/22/2011   Procedure: CYSTOSCOPY;  Surgeon: Myra C. Hulan Fray, MD;  Location: [facility];  Service: Gynecology;  Laterality: N/A;  . DILATION AND CURETTAGE OF UTERUS    . INDUCED ABORTION     x 3  . KNEE ARTHROSCOPY    . SALIVARY STONE REMOVAL     gland removed right     OB History    Gravida  5   Para  2   Term  1   Preterm  1   AB  3   Living  1     SAB      TAB  3   Ectopic      Multiple      Live Births  1            Home Medications    Prior to Admission medications   Medication Sig Start Date End Date Taking? Authorizing Provider  amLODipine (NORVASC) 5 MG tablet Take 2 tablets (10 mg total) by mouth daily. 11/16/13   Harvie Heck, PA-C  BAYER MICROLET LANCETS lancets Use as instructed 10/14/12   Luvenia Redden, PA-C  cyclobenzaprine (FLEXERIL) 10 MG tablet Take 10 mg by mouth 3 (three) times daily as needed for muscle spasms.    [provider]  glucose blood (BAYER CONTOUR NEXT TEST) test strip Use as instructed 10/14/12   Luvenia Redden, PA-C  hydrochlorothiazide (HYDRODIURIL) 25 MG tablet Take 1 tablet (25 mg total) by mouth daily. 11/16/13   Harvie Heck, PA-C  HYDROcodone-acetaminophen (NORCO/VICODIN) 5-325 MG per tablet Take 1 tablet by mouth every 6 (six) hours as needed for moderate pain or severe pain. 11/16/13  Harvie Heck, PA-C  meclizine (ANTIVERT) 50 MG tablet Take 1 tablet (50 mg total) by mouth 3 (three) times daily as needed. Patient not taking: Reported on 04/08/2017 11/16/13   Harvie Heck, PA-C  metFORMIN (GLUCOPHAGE) 500 MG tablet Take 1 tablet (500 mg total) by mouth 2 (two) times daily with a meal. 06/01/12   Blanchie Dessert, MD    Family History Family History  Problem Relation Age of Onset  . Cancer Father        throat  . Esophageal cancer Father   . Cancer Brother        leukemia  . Cancer Daughter        breast  . Diabetes Mother   . Diabetes Paternal Aunt   . Thyroid disease Maternal Grandmother   . Anesthesia problems Neg Hx   . Colon cancer Neg Hx   . Liver cancer Neg Hx   . Pancreatic cancer Neg Hx   . Rectal cancer Neg Hx   . Stomach cancer Neg Hx     Social History Social History   Tobacco Use  . Smoking status: Current Every Day Smoker    Packs/day: 0.50    Years: 30.00    Pack years: 15.00     Types: Cigarettes  . Smokeless tobacco: Never Used  Substance Use Topics  . Alcohol use: No  . Drug use: No    Types: Cocaine    Comment: Pt has been clean 14 years     Allergies   Shellfish allergy   Review of Systems Review of Systems  Unable to perform ROS: Other     Physical Exam Updated Vital Signs BP (!) 148/97   Pulse (!) 118   Temp 98.9 F (37.2 C) (Oral)   Resp 15   Ht 5\' 5"  (1.651 m)   Wt 86.2 kg (190 lb)   LMP 11/18/2010   SpO2 99%   BMI 31.62 kg/m   Physical Exam  Constitutional: She is oriented to person, place, and time. She appears well-developed and well-nourished. She appears distressed.  HENT:  Head: Normocephalic and atraumatic.  Right Ear: External ear normal.  Left Ear: External ear normal.  Nose: Nose normal.  Eyes: Right eye exhibits no discharge. Left eye exhibits no discharge.  Bilateral eye injection  Cardiovascular: Regular rhythm and normal heart sounds. Tachycardia present.  Pulmonary/Chest: Effort normal and breath sounds normal.  Abdominal: Soft. There is no tenderness.  Neurological: She is alert and oriented to person, place, and time.  Skin: Skin is warm and dry. She is not diaphoretic.  Psychiatric:  Crying, grieving  Nursing note and vitals reviewed.    ED Treatments / Results  Labs (all labs ordered are listed, but only abnormal results are displayed) Labs Reviewed  CBC WITH DIFFERENTIAL/PLATELET - Abnormal; Notable for the following components:      Result Value   WBC 12.4 (*)    Neutro Abs 9.0 (*)    All other components within normal limits  BASIC METABOLIC PANEL - Abnormal; Notable for the following components:   Potassium 3.3 (*)    Glucose, Bld 205 (*)    All other components within normal limits    EKG EKG Interpretation  Date/Time:  Wednesday July 06 2017 13:16:09 EDT Ventricular Rate:  127 PR Interval:    QRS Duration: 87 QT Interval:  289 QTC Calculation: 420 R Axis:   27 Text  Interpretation:  Sinus tachycardia Ventricular premature complex Left atrial enlargement Repol abnrm suggests ischemia,  diffuse leads rate is faster, ST changes similar to Aug 2017 Confirmed by Sherwood Gambler [phone]) on 07/06/2017 1:51:20 PM   Radiology No results found.  Procedures Procedures (including critical care time)  Medications Ordered in ED Medications  sodium chloride 0.9 % bolus 1,000 mL (1,000 mLs Intravenous New Bag/Given 07/06/17 1354)  LORazepam (ATIVAN) injection 1 mg (1 mg Intravenous Given 07/06/17 1355)     Initial Impression / Assessment and Plan / ED Course  I have reviewed the triage vital signs and the nursing notes.  Pertinent labs & imaging results that were available during my care of the patient were reviewed by me and considered in my medical decision making (see chart for details).     Patient appears to be having a significant grief reaction from her son dying.  She has not made any suicidal threats.  The tachycardia is almost undoubtedly from the agitation and grief she is having.  I doubt PE in this scenario.  It sounds like she has briefly passed out a couple times while hyperventilating.  I doubt cardiac emergency or respiratory emergency.  I think otherwise she is stable for discharge as she is requesting to go.  She is noted to have a mild hypokalemia and was given potassium.  I will give her a few doses of Ativan at home for this acute.  But discussed return precautions with her and the family.  The leukocytosis is likely a stress response as well.  Final Clinical Impressions(s) / ED Diagnoses   Final diagnoses:  Grief reaction  Syncope, unspecified syncope type    ED Discharge Orders    None       Sherwood Gambler, MD 07/06/17 1523

## 2017-09-22 ENCOUNTER — Encounter (HOSPITAL_COMMUNITY): Payer: Self-pay | Admitting: Emergency Medicine

## 2017-09-22 ENCOUNTER — Emergency Department (HOSPITAL_COMMUNITY): Payer: Self-pay

## 2017-09-22 ENCOUNTER — Emergency Department (HOSPITAL_COMMUNITY)
Admission: EM | Admit: 2017-09-22 | Discharge: 2017-09-22 | Disposition: A | Discharge status: Home / Self Care | Payer: Self-pay | Attending: Emergency Medicine | Admitting: Emergency Medicine

## 2017-09-22 DIAGNOSIS — I10 Essential (primary) hypertension: Secondary | ICD-10-CM | POA: Insufficient documentation

## 2017-09-22 DIAGNOSIS — Z79899 Other long term (current) drug therapy: Secondary | ICD-10-CM | POA: Insufficient documentation

## 2017-09-22 DIAGNOSIS — Y999 Unspecified external cause status: Secondary | ICD-10-CM | POA: Insufficient documentation

## 2017-09-22 DIAGNOSIS — S60011A Contusion of right thumb without damage to nail, initial encounter: Secondary | ICD-10-CM | POA: Insufficient documentation

## 2017-09-22 DIAGNOSIS — S60221A Contusion of right hand, initial encounter: Secondary | ICD-10-CM

## 2017-09-22 DIAGNOSIS — Y939 Activity, unspecified: Secondary | ICD-10-CM | POA: Insufficient documentation

## 2017-09-22 DIAGNOSIS — S6010XA Contusion of unspecified finger with damage to nail, initial encounter: Secondary | ICD-10-CM

## 2017-09-22 DIAGNOSIS — W231XXA Caught, crushed, jammed, or pinched between stationary objects, initial encounter: Secondary | ICD-10-CM | POA: Insufficient documentation

## 2017-09-22 DIAGNOSIS — Z7984 Long term (current) use of oral hypoglycemic drugs: Secondary | ICD-10-CM | POA: Insufficient documentation

## 2017-09-22 DIAGNOSIS — Y929 Unspecified place or not applicable: Secondary | ICD-10-CM | POA: Insufficient documentation

## 2017-09-22 DIAGNOSIS — F17210 Nicotine dependence, cigarettes, uncomplicated: Secondary | ICD-10-CM | POA: Insufficient documentation

## 2017-09-22 DIAGNOSIS — E119 Type 2 diabetes mellitus without complications: Secondary | ICD-10-CM | POA: Insufficient documentation

## 2017-09-22 NOTE — ED Provider Notes (Signed)
[facility] DEPT Provider Note   CSN: [id number] Arrival date & time: 09/22/17  1130     History   Chief Complaint Chief Complaint  Patient presents with  . Finger Injury    HPI Elizabeth Steele is a 52 y.o. female who presents to the ED with c/o right thumb pain after she closed it in a car door yesterday. Patient also c/o pain to the palm of the hand and right index finger. Patient is up to date on tetanus.  HPI  Past Medical History:  Diagnosis Date  . Abnormal Pap smear   . Allergy   . Anemia   . Anxiety   . Arthritis   . Blood transfusion without reported diagnosis   . Boil   . Diabetes mellitus without complication ([place])   . Fibroids   . Goiter   . Hypertension    takes hctz  . MRSA (methicillin resistant Staphylococcus aureus)    2005  . Ovarian cyst   . PID (pelvic inflammatory disease)   . Substance abuse ([place])   . Thyroid disease    has a goiter, doesn't need medical management  . Urinary tract infection     Patient Active Problem List   Diagnosis Date Noted  . Fibroid uterus 12/11/2010  . Abnormal uterine bleeding 12/11/2010  . HTN (hypertension) 12/11/2010    Past Surgical History:  Procedure Laterality Date  . ABDOMINAL HYSTERECTOMY    . COLPOSCOPY    . CYSTOSCOPY  01/22/2011   Procedure: CYSTOSCOPY;  Surgeon: Myra C. Hulan Fray, MD;  Location: [facility];  Service: Gynecology;  Laterality: N/A;  . DILATION AND CURETTAGE OF UTERUS    . INDUCED ABORTION     x 3  . KNEE ARTHROSCOPY    . SALIVARY STONE REMOVAL     gland removed right     OB History    Gravida  5   Para  2   Term  1   Preterm  1   AB  3   Living  1     SAB      TAB  3   Ectopic      Multiple      Live Births  1            Home Medications    Prior to Admission medications   Medication Sig Start Date End Date Taking? Authorizing Provider  amLODipine (NORVASC) 5 MG tablet Take 2 tablets (10 mg total) by mouth daily. 11/16/13    Harvie Heck, PA-C  BAYER MICROLET LANCETS lancets Use as instructed 10/14/12   Luvenia Redden, PA-C  cyclobenzaprine (FLEXERIL) 10 MG tablet Take 10 mg by mouth 3 (three) times daily as needed for muscle spasms.    [provider]  glucose blood (BAYER CONTOUR NEXT TEST) test strip Use as instructed 10/14/12   Luvenia Redden, PA-C  hydrochlorothiazide (HYDRODIURIL) 25 MG tablet Take 1 tablet (25 mg total) by mouth daily. 11/16/13   Harvie Heck, PA-C  HYDROcodone-acetaminophen (NORCO/VICODIN) 5-325 MG per tablet Take 1 tablet by mouth every 6 (six) hours as needed for moderate pain or severe pain. 11/16/13   Harvie Heck, PA-C  LORazepam (ATIVAN) 1 MG tablet Take 1 tablet (1 mg total) by mouth every 6 (six) hours as needed for anxiety. 07/06/17   Sherwood Gambler, MD  meclizine (ANTIVERT) 50 MG tablet Take 1 tablet (50 mg total) by mouth 3 (three) times daily as needed. Patient not taking: Reported  on 04/08/2017 11/16/13   Harvie Heck, PA-C  metFORMIN (GLUCOPHAGE) 500 MG tablet Take 1 tablet (500 mg total) by mouth 2 (two) times daily with a meal. 06/01/12   Blanchie Dessert, MD    Family History Family History  Problem Relation Age of Onset  . Cancer Father        throat  . Esophageal cancer Father   . Cancer Brother        leukemia  . Cancer Daughter        breast  . Diabetes Mother   . Diabetes Paternal Aunt   . Thyroid disease Maternal Grandmother   . Anesthesia problems Neg Hx   . Colon cancer Neg Hx   . Liver cancer Neg Hx   . Pancreatic cancer Neg Hx   . Rectal cancer Neg Hx   . Stomach cancer Neg Hx     Social History Social History   Tobacco Use  . Smoking status: Current Every Day Smoker    Packs/day: 0.50    Years: 30.00    Pack years: 15.00    Types: Cigarettes  . Smokeless tobacco: Never Used  Substance Use Topics  . Alcohol use: No  . Drug use: No    Types: Cocaine    Comment: Pt has been clean 14 years     Allergies   Shellfish  allergy   Review of Systems Review of Systems  Musculoskeletal: Positive for arthralgias.  All other systems reviewed and are negative.    Physical Exam Updated Vital Signs BP (!) 130/91   Pulse 90   Temp 97.6 F (36.4 C)   Resp 18   LMP 11/18/2010   SpO2 98%   Physical Exam  Constitutional: She appears well-developed and well-nourished. No distress.  HENT:  Head: Normocephalic.  Eyes: Conjunctivae and EOM are normal.  Neck: Neck supple.  Cardiovascular: Normal rate.  Pulmonary/Chest: Effort normal.  Musculoskeletal: Normal range of motion.       Right hand: She exhibits tenderness and swelling. She exhibits normal range of motion, normal capillary refill, no deformity and no laceration. Normal sensation noted. Normal strength noted. She exhibits no thumb/finger opposition.  Subungual hematoma of the right thumb.   Neurological: She is alert.  Skin: Skin is warm and dry.  Psychiatric: She has a normal mood and affect.  Nursing note and vitals reviewed.    ED Treatments / Results  Labs (all labs ordered are listed, but only abnormal results are displayed) Labs Reviewed - No data to display  Radiology Dg Hand Complete Right  Result Date: 09/22/2017 CLINICAL DATA:  Injured right thumb in car door yesterday with pain EXAM: RIGHT HAND - COMPLETE 3+ VIEW COMPARISON:  None. FINDINGS: The right radiocarpal joint space appears normal and the ulnar styloid is intact. The carpal bones are normal position. MCP, DIP and PIP joints appear normal. No fracture or dislocation is seen. No opaque foreign body is noted. IMPRESSION: Negative. Electronically Signed   By: Ivar Drape M.D.   On: 09/22/2017 13:19    Procedures .Marland KitchenIncision and Drainage Date/Time: 09/22/2017 1:30 PM Performed by: Ashley Murrain, NP Authorized by: Ashley Murrain, NP   Consent:    Consent obtained:  Verbal   Consent given by:  Patient   Risks discussed:  Bleeding and incomplete drainage   Alternatives  discussed:  No treatment Location:    Type:  Subungual hematoma   Location:  Upper extremity   Upper extremity location:  Finger  Finger location:  R thumb Anesthesia (see MAR for exact dosages):    Anesthesia method:  None Procedure type:    Complexity:  Simple Procedure details:    Incision type: cautery stick.   Incision depth:  Subungual   Drainage:  Bloody   Drainage amount:  Scant   Wound treatment:  Wound left open Post-procedure details:    Patient tolerance of procedure:  Tolerated well, no immediate complications   (including critical care time)  Medications Ordered in ED Medications - No data to display   Initial Impression / Assessment and Plan / ED Course  I have reviewed the triage vital signs and the nursing notes. 52 y.o. female here with pain and swelling to the right hand s/p injury yesterday stable for d/c without fracture or dislocation noted on x-ray. Thumb spica Velcro splint applied, pain managed, return precautions discussed.   Final Clinical Impressions(s) / ED Diagnoses   Final diagnoses:  Subungual hematoma of digit of hand, initial encounter  Contusion of right hand, initial encounter    ED Discharge Orders    None       Debroah Baller [place], NP 09/22/17 2218    Drenda Freeze, MD 09/23/17 [phone]

## 2017-09-22 NOTE — ED Triage Notes (Signed)
Pt reports shutting her right thumb in car door yesterday. Having pain in palm and right index finger as well/

## 2017-09-22 NOTE — Discharge Instructions (Signed)
Wear the splint for comfort. Follow up with your doctor. Return here as needed.

## 2017-11-19 ENCOUNTER — Emergency Department (HOSPITAL_COMMUNITY)
Admission: EM | Admit: 2017-11-19 | Discharge: 2017-11-19 | Disposition: A | Discharge status: Home / Self Care | Payer: Medicaid Other | Attending: Emergency Medicine | Admitting: Emergency Medicine

## 2017-11-19 ENCOUNTER — Encounter (HOSPITAL_COMMUNITY): Payer: Self-pay | Admitting: *Deleted

## 2017-11-19 DIAGNOSIS — N764 Abscess of vulva: Secondary | ICD-10-CM

## 2017-11-19 DIAGNOSIS — E119 Type 2 diabetes mellitus without complications: Secondary | ICD-10-CM | POA: Insufficient documentation

## 2017-11-19 DIAGNOSIS — F17210 Nicotine dependence, cigarettes, uncomplicated: Secondary | ICD-10-CM | POA: Insufficient documentation

## 2017-11-19 DIAGNOSIS — Z7984 Long term (current) use of oral hypoglycemic drugs: Secondary | ICD-10-CM | POA: Insufficient documentation

## 2017-11-19 DIAGNOSIS — Z23 Encounter for immunization: Secondary | ICD-10-CM | POA: Insufficient documentation

## 2017-11-19 DIAGNOSIS — I10 Essential (primary) hypertension: Secondary | ICD-10-CM | POA: Insufficient documentation

## 2017-11-19 DIAGNOSIS — Z79899 Other long term (current) drug therapy: Secondary | ICD-10-CM | POA: Insufficient documentation

## 2017-11-19 MED ORDER — TETANUS-DIPHTH-ACELL PERTUSSIS 5-2.5-18.5 LF-MCG/0.5 IM SUSP
0.5000 mL | Freq: Once | INTRAMUSCULAR | Status: AC
  Administered 2017-11-19: 0.5 mL via INTRAMUSCULAR
  Filled 2017-11-19: qty 0.5

## 2017-11-19 MED ORDER — HYDROCODONE-ACETAMINOPHEN 5-325 MG PO TABS: 1.0000 | ORAL_TABLET | Freq: Four times a day (QID) | ORAL | 0 refills | Status: DC | PRN

## 2017-11-19 MED ORDER — HYDROCODONE-ACETAMINOPHEN 5-325 MG PO TABS
1.0000 | ORAL_TABLET | Freq: Once | ORAL | Status: AC
  Administered 2017-11-19: 1 via ORAL
  Filled 2017-11-19: qty 1

## 2017-11-19 MED ORDER — LIDOCAINE HCL (PF) 1 % IJ SOLN
10.0000 mL | Freq: Once | INTRAMUSCULAR | Status: AC
  Administered 2017-11-19: 10 mL
  Filled 2017-11-19: qty 30

## 2017-11-19 MED ORDER — SULFAMETHOXAZOLE-TRIMETHOPRIM 800-160 MG PO TABS: 1.0000 | ORAL_TABLET | Freq: Two times a day (BID) | ORAL | 0 refills | Status: AC

## 2017-11-19 NOTE — ED Provider Notes (Signed)
[facility] DE[facility] Provider Note   CSN: [id number] Arrival date & time: 11/19/17  1752   History   Chief Complaint Chief Complaint  Patient presents with  . Abscess    HPI Elizabeth Steele is a 52 y.o. female with past medical history significant for hypertension and diabetes who presents for evaluation of right labial abscess.  Patient states she noted tenderness to her right labia 2 days ago.  Patient states that she woke up this morning and the area was "swollen."  Patient states she does have a history of abscesses.  Last abscess approximately 2 years ago.  Patient unaware of last tetanus vaccine.  Patient states her pain is rated a 9/10.  Has not taken anything for her pain.  Denies fever, chills, nausea, vomiting, abdominal pain, dysuria, redness or warmth to labia, diarrhea or constipation.  Denies aggravating or alleviating factors.  Patient is a known diabetic.  She states her blood sugars are under control,  normally running approximately 150.  History obtained from patient.  No interpreter was used.  HPI  Past Medical History:  Diagnosis Date  . Abnormal Pap smear   . Allergy   . Anemia   . Anxiety   . Arthritis   . Blood transfusion without reported diagnosis   . Boil   . Diabetes mellitus without complication ([place])   . Fibroids   . Goiter   . Hypertension    takes hctz  . MRSA (methicillin resistant Staphylococcus aureus)    2005  . Ovarian cyst   . PID (pelvic inflammatory disease)   . Substance abuse ([place])   . Thyroid disease    has a goiter, doesn't need medical management  . Urinary tract infection     Patient Active Problem List   Diagnosis Date Noted  . Fibroid uterus 12/11/2010  . Abnormal uterine bleeding 12/11/2010  . HTN (hypertension) 12/11/2010    Past Surgical History:  Procedure Laterality Date  . ABDOMINAL HYSTERECTOMY    . COLPOSCOPY    . CYSTOSCOPY  01/22/2011   Procedure: CYSTOSCOPY;  Surgeon: Myra C.  Hulan Fray, MD;  Location: [facility];  Service: Gynecology;  Laterality: N/A;  . DILATION AND CURETTAGE OF UTERUS    . INDUCED ABORTION     x 3  . KNEE ARTHROSCOPY    . SALIVARY STONE REMOVAL     gland removed right     OB History    Gravida  5   Para  2   Term  1   Preterm  1   AB  3   Living  1     SAB      TAB  3   Ectopic      Multiple      Live Births  1            Home Medications    Prior to Admission medications   Medication Sig Start Date End Date Taking? Authorizing Provider  amLODipine (NORVASC) 10 MG tablet Take 10 mg by mouth daily. 11/17/17  Yes [provider]  hydrALAZINE (APRESOLINE) 25 MG tablet Take 25 mg by mouth 3 (three) times daily.   Yes [provider]  hydrochlorothiazide (HYDRODIURIL) 12.5 MG tablet Take 12.5 mg by mouth daily. 11/17/17  Yes [provider]  metFORMIN (GLUCOPHAGE) 500 MG tablet Take 1 tablet (500 mg total) by mouth 2 (two) times daily with a meal. 06/01/12  Yes Blanchie Dessert, MD  traZODone (DESYREL) 50 MG  tablet Take 50 mg by mouth at bedtime. 09/19/17  Yes [provider]  BAYER MICROLET LANCETS lancets Use as instructed 10/14/12   Luvenia Redden, PA-C  glucose blood (BAYER CONTOUR NEXT TEST) test strip Use as instructed 10/14/12   Luvenia Redden, PA-C  HYDROcodone-acetaminophen (NORCO/VICODIN) 5-325 MG tablet Take 1 tablet by mouth every 6 (six) hours as needed. 11/19/17   Galileah Piggee A, PA-C  sulfamethoxazole-trimethoprim (BACTRIM DS,SEPTRA DS) 800-160 MG tablet Take 1 tablet by mouth 2 (two) times daily for 7 days. 11/19/17 11/26/17  Mykale Gandolfo A, PA-C    Family History Family History  Problem Relation Age of Onset  . Cancer Father        throat  . Esophageal cancer Father   . Cancer Brother        leukemia  . Cancer Daughter        breast  . Diabetes Mother   . Diabetes Paternal Aunt   . Thyroid disease Maternal Grandmother   . Anesthesia problems Neg Hx   . Colon  cancer Neg Hx   . Liver cancer Neg Hx   . Pancreatic cancer Neg Hx   . Rectal cancer Neg Hx   . Stomach cancer Neg Hx     Social History Social History   Tobacco Use  . Smoking status: Current Every Day Smoker    Packs/day: 0.50    Years: 30.00    Pack years: 15.00    Types: Cigarettes  . Smokeless tobacco: Never Used  Substance Use Topics  . Alcohol use: No  . 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   Respiratory: Negative.   Cardiovascular: Negative.   Gastrointestinal: Negative.   Genitourinary:       Right labial abscess.  Musculoskeletal: Negative.   Skin: Positive for wound.  All other systems reviewed and are negative.    Physical Exam Updated Vital Signs BP (!) 145/89 (BP Location: Left Arm)   Pulse 93   Temp 98.7 F (37.1 C) (Oral)   Resp 16   LMP 11/18/2010   SpO2 96%   Physical Exam  Constitutional: She appears well-developed and well-nourished. No distress.  HENT:  Head: Atraumatic.  Mouth/Throat: Oropharynx is clear and moist.  Eyes: Pupils are equal, round, and reactive to light.  Neck: Normal range of motion.  Cardiovascular: Normal rate and intact distal pulses.  Pulmonary/Chest: No respiratory distress.  Abdominal: She exhibits no distension.  Genitourinary:     Genitourinary Comments: Right labial abscess, not Bartholin gland abscess.  Area with 3 cm area of induration.  1 cm area of fluctuance.  Mild surrounding erythema.  Area is not warm.  Area tender to touch. No rectal involvement.  Musculoskeletal: Normal range of motion.  Neurological: She is alert.  Skin: Skin is warm and dry. She is not diaphoretic.  Psychiatric: She has a normal mood and affect.  Nursing note and vitals reviewed.    ED Treatments / Results  Labs (all labs ordered are listed, but only abnormal results are  displayed) Labs Reviewed - No data to display  EKG None  Radiology No results found.  Procedures .Marland KitchenIncision and Drainage Date/Time: 11/19/2017 8:16 PM Performed by: Shelby Dubin A, PA-C Authorized by: Nettie Elm, PA-C   Consent:    Consent obtained:  Verbal  Consent given by:  Patient   Risks discussed:  Bleeding, incomplete drainage, pain and damage to other organs   Alternatives discussed:  No treatment, delayed treatment, alternative treatment, observation and referral Universal protocol:    Procedure explained and questions answered to patient or proxy's satisfaction: yes     Relevant documents present and verified: yes     Test results available and properly labeled: yes     Imaging studies available: yes     Required blood products, implants, devices, and special equipment available: yes     Site/side marked: yes     Immediately prior to procedure a time out was called: yes     Patient identity confirmed:  Verbally with patient Location:    Type:  Abscess (right labial)   Location:  Anogenital   Anogenital location: right labia. Pre-procedure details:    Skin preparation:  Chloraprep Anesthesia (see MAR for exact dosages):    Anesthesia method:  Local infiltration   Local anesthetic:  Lidocaine 1% w/o epi Procedure type:    Complexity:  Complex Procedure details:    Incision types:  Single straight   Incision depth:  Subcutaneous   Scalpel blade:  11   Wound management:  Probed and deloculated, irrigated with saline and extensive cleaning   Drainage:  Purulent   Drainage amount:  Moderate   Packing materials:  1/4 in gauze Post-procedure details:    Patient tolerance of procedure:  Tolerated well, no immediate complications [place] bedside Date/Time: 11/19/2017 8:21 PM Performed by: Nettie Elm, PA-C Authorized by: Nettie Elm, PA-C  Consent: Verbal consent obtained. Risks and benefits: risks, benefits and alternatives were  discussed Consent given by: patient Patient understanding: patient states understanding of the procedure being performed Patient consent: the patient's understanding of the procedure matches consent given Procedure consent: procedure consent matches procedure scheduled Relevant documents: relevant documents present and verified Test results: test results available and properly labeled Site marked: the operative site was marked Imaging studies: imaging studies available Patient identity confirmed: verbally with patient Time out: Immediately prior to procedure a "time out" was called to verify the correct patient, procedure, equipment, support staff and site/side marked as required. Preparation: Patient was prepped and draped in the usual sterile fashion. Local anesthesia used: no  Anesthesia: Local anesthesia used: no  Sedation: Patient sedated: no  Patient tolerance: Patient tolerated the procedure well with no immediate complications Comments: Positive for hypoechoic fluid to right labia.    (including critical care time)  Medications Ordered in ED Medications  HYDROcodone-acetaminophen (NORCO/VICODIN) 5-325 MG per tablet 1 tablet (1 tablet Oral Given 11/19/17 1921)  lidocaine (PF) (XYLOCAINE) 1 % injection 10 mL (10 mLs Infiltration Given 11/19/17 1924)  Tdap (BOOSTRIX) injection 0.5 mL (0.5 mLs Intramuscular Given 11/19/17 1922)     Initial Impression / Assessment and Plan / ED Course  I have reviewed the triage vital signs and the nursing notes.  Pertinent labs & imaging results that were available during my care of the patient were reviewed by me and considered in my medical decision making (see chart for details).  51 year old female who appears otherwise well presents for evaluation of right labial abscess. Afebrile, nonseptic, non-ill-appearing tenderness to right labia with 1 cm area of fluctuance with 3 cm of surrounding induration.  Mild surrounding erythema without  warmth.  Abscess is not a Bartholin cyst.  Patient amenable to incision and drainage.  Provide pain medicine prior to drainage.  Patient states she has a  ride home with her daughter.  Tetanus will be updated during her visit today.  Incision and drainage with moderate purulent drainage.  See procedure note. Packing placed.  Discussed with patient she will need wound check and removal of packing in 2 days.  I have prescribed her an antibiotic as well as a short course of pain medicine #6 Norco.  I looked patient up in the PMP aware database and she has received 1 narcotic prescription in the last year.  Discussed strict return precautions with patient.  Patient voiced understanding is agreeable for follow-up.  Stable for DC home at this time.    Final Clinical Impressions(s) / ED Diagnoses   Final diagnoses:  Labial abscess    ED Discharge Orders         Ordered    sulfamethoxazole-trimethoprim (BACTRIM DS,SEPTRA DS) 800-160 MG tablet  2 times daily     11/19/17 1917    HYDROcodone-acetaminophen (NORCO/VICODIN) 5-325 MG tablet  Every 6 hours PRN     11/19/17 1917           Cache Bills A, PA-C 11/19/17 2029    Duffy Bruce, MD 11/20/17 1154

## 2017-11-19 NOTE — ED Triage Notes (Signed)
Pt complains of abscess to inner thigh for the past 3 days. Pt has hx of diabetes.

## 2017-11-19 NOTE — Discharge Instructions (Addendum)
Your evaluated today for groin pain.  You do have a right-sided labial abscess.  This was drained in the department.  I have placed packing.  This packing will need to be removed in 2 days.  You also need a wound check in 2 days.  You may follow-up with your primary care provider, urgent care or return to the emergency department for your reevaluation.  I have also prescribed you an antibiotic.  Please take as prescribed.  Return to the ED for any worsening symptoms such as: Get help right away if: You have severe pain or bleeding. You cannot eat or drink without vomiting. You have decreased urine output. You become short of breath. You have chest pain. You cough up blood. The area where the incision and drainage occurred becomes numb or it tingles.

## 2017-11-22 ENCOUNTER — Emergency Department (HOSPITAL_COMMUNITY)
Admission: EM | Admit: 2017-11-22 | Discharge: 2017-11-22 | Disposition: A | Discharge status: Home / Self Care | Payer: Medicaid Other | Attending: Emergency Medicine | Admitting: Emergency Medicine

## 2017-11-22 ENCOUNTER — Other Ambulatory Visit: Payer: Self-pay

## 2017-11-22 ENCOUNTER — Encounter (HOSPITAL_COMMUNITY): Payer: Self-pay

## 2017-11-22 DIAGNOSIS — Z4801 Encounter for change or removal of surgical wound dressing: Secondary | ICD-10-CM | POA: Insufficient documentation

## 2017-11-22 DIAGNOSIS — F17210 Nicotine dependence, cigarettes, uncomplicated: Secondary | ICD-10-CM | POA: Insufficient documentation

## 2017-11-22 DIAGNOSIS — E119 Type 2 diabetes mellitus without complications: Secondary | ICD-10-CM | POA: Insufficient documentation

## 2017-11-22 DIAGNOSIS — Z79899 Other long term (current) drug therapy: Secondary | ICD-10-CM | POA: Insufficient documentation

## 2017-11-22 DIAGNOSIS — I10 Essential (primary) hypertension: Secondary | ICD-10-CM | POA: Insufficient documentation

## 2017-11-22 DIAGNOSIS — Z5189 Encounter for other specified aftercare: Secondary | ICD-10-CM

## 2017-11-22 NOTE — ED Notes (Signed)
Bed: WTR5 Expected date:  Expected time:  Means of arrival:  Comments: 

## 2017-11-22 NOTE — Discharge Instructions (Signed)
Please continue to take your antibiotics as prescribed during your previous visit.  If you experience any fever, chills, worsening symptoms you may return to the ED for reevaluation or schedule an appointment with your primary care physician.

## 2017-11-22 NOTE — ED Provider Notes (Signed)
[facility] DEPT Provider Note   CSN: [id number] Arrival date & time: 11/22/17  1015     History   Chief Complaint Chief Complaint  Patient presents with  . Wound Check    HPI Elizabeth Steele is a 52 y.o. female.  52 y.o female with a PMH of DM,anxiety presents to the ED with a chief complaint of wound recheck. Patient abscess drained 2 days ago and was started on Bactrim, she was advised to return to the ED for reevaluation of her wound.  She reports the wound packing came off and drainage has been coming around from it. However there is no redness to the area, denies any fever or tenderness to palpation at this time.     Past Medical History:  Diagnosis Date  . Abnormal Pap smear   . Allergy   . Anemia   . Anxiety   . Arthritis   . Blood transfusion without reported diagnosis   . Boil   . Diabetes mellitus without complication ([place])   . Fibroids   . Goiter   . Hypertension    takes hctz  . MRSA (methicillin resistant Staphylococcus aureus)    2005  . Ovarian cyst   . PID (pelvic inflammatory disease)   . Substance abuse ([place])   . Thyroid disease    has a goiter, doesn't need medical management  . Urinary tract infection     Patient Active Problem List   Diagnosis Date Noted  . Fibroid uterus 12/11/2010  . Abnormal uterine bleeding 12/11/2010  . HTN (hypertension) 12/11/2010    Past Surgical History:  Procedure Laterality Date  . ABDOMINAL HYSTERECTOMY    . COLPOSCOPY    . CYSTOSCOPY  01/22/2011   Procedure: CYSTOSCOPY;  Surgeon: Myra C. Hulan Fray, MD;  Location: [facility];  Service: Gynecology;  Laterality: N/A;  . DILATION AND CURETTAGE OF UTERUS    . INDUCED ABORTION     x 3  . KNEE ARTHROSCOPY    . SALIVARY STONE REMOVAL     gland removed right     OB History    Gravida  5   Para  2   Term  1   Preterm  1   AB  3   Living  1     SAB      TAB  3   Ectopic      Multiple      Live Births  1             Home Medications    Prior to Admission medications   Medication Sig Start Date End Date Taking? Authorizing Provider  amLODipine (NORVASC) 10 MG tablet Take 10 mg by mouth daily. 11/17/17   [provider]  BAYER MICROLET LANCETS lancets Use as instructed 10/14/12   Luvenia Redden, PA-C  glucose blood (BAYER CONTOUR NEXT TEST) test strip Use as instructed 10/14/12   Luvenia Redden, PA-C  hydrALAZINE (APRESOLINE) 25 MG tablet Take 25 mg by mouth 3 (three) times daily.    [provider]  hydrochlorothiazide (HYDRODIURIL) 12.5 MG tablet Take 12.5 mg by mouth daily. 11/17/17   [provider]  HYDROcodone-acetaminophen (NORCO/VICODIN) 5-325 MG tablet Take 1 tablet by mouth every 6 (six) hours as needed. 11/19/17   Henderly, Britni A, PA-C  metFORMIN (GLUCOPHAGE) 500 MG tablet Take 1 tablet (500 mg total) by mouth 2 (two) times daily with a meal. 06/01/12   Blanchie Dessert, MD  sulfamethoxazole-trimethoprim (BACTRIM  DS,SEPTRA DS) 800-160 MG tablet Take 1 tablet by mouth 2 (two) times daily for 7 days. 11/19/17 11/26/17  Henderly, Britni A, PA-C  traZODone (DESYREL) 50 MG tablet Take 50 mg by mouth at bedtime. 09/19/17   [provider]    Family History Family History  Problem Relation Age of Onset  . Cancer Father        throat  . Esophageal cancer Father   . Cancer Brother        leukemia  . Cancer Daughter        breast  . Diabetes Mother   . Diabetes Paternal Aunt   . Thyroid disease Maternal Grandmother   . Anesthesia problems Neg Hx   . Colon cancer Neg Hx   . Liver cancer Neg Hx   . Pancreatic cancer Neg Hx   . Rectal cancer Neg Hx   . Stomach cancer Neg Hx     Social History Social History   Tobacco Use  . Smoking status: Current Every Day Smoker    Packs/day: 0.50    Years: 30.00    Pack years: 15.00    Types: Cigarettes  . Smokeless tobacco: Never Used  Substance Use Topics  . Alcohol use: No  . 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  Skin: Positive for wound.     Physical Exam Updated Vital Signs BP 119/87 (BP Location: Right Arm)   Pulse (!) 103   Temp 98.4 F (36.9 C) (Oral)   Resp 18   Wt 88 kg   LMP 11/18/2010   SpO2 97%   BMI 32.28 kg/m   Physical Exam  Constitutional: She is oriented to person, place, and time. She appears well-developed and well-nourished. No distress.  HENT:  Head: Normocephalic and atraumatic.  Neck: Normal range of motion.  Cardiovascular: Regular rhythm and normal heart sounds.  Pulmonary/Chest: Effort normal and breath sounds normal. No respiratory distress.  Abdominal: Bowel sounds are normal. She exhibits no distension. There is no tenderness.  Musculoskeletal: She exhibits no tenderness or deformity.       Right lower leg: She exhibits no edema.       Left lower leg: She exhibits no edema.  Neurological: She is alert and oriented to person, place, and time.  Skin: Skin is warm and dry.     Psychiatric: She has a normal mood and affect.  Nursing note and vitals reviewed.    ED Treatments / Results  Labs (all labs ordered are listed, but only abnormal results are displayed) Labs Reviewed - No data to display  EKG None  Radiology No results found.  Procedures Procedures (including critical care time)  Medications Ordered in ED Medications - No data to display   Initial Impression / Assessment and Plan / ED Course  I have reviewed the triage vital signs and the nursing notes.  Pertinent labs & imaging results that were available during my care of the patient were reviewed by me and considered in my medical decision making (see chart for details).    Presents for wound recheck, she had an I&D done as ago and was placed on Bactrim, patient reports she is been taking her antibiotics and the  wound has been draining and she is been cleaning and dressing daily.  During examination  there is no erythema, drainage, slight induration noted but looks well-appearing.  Patient is advised to continue antibiotic Arabi, she is requesting a work note we will provide this for patient.  Return precautions provided.   Final Clinical Impressions(s) / ED Diagnoses   Final diagnoses:  Abscess re-check    ED Discharge Orders    None       Janeece Fitting, PA-C 11/22/17 1216    Malvin Johns, MD 11/22/17 1232

## 2017-11-22 NOTE — ED Triage Notes (Signed)
Pt arrives via POV from home. Pt reports that on Saturday she had an abscess drained on groin. Pt was told to have the wound rechecked. Pt reports that the wound is healing well. Pt denies fevers/swelling to the area.  Pt endorses pain. Pt reports that she has been taking Bactrim that was prescribed.

## 2018-02-10 ENCOUNTER — Other Ambulatory Visit: Payer: Self-pay

## 2018-02-10 ENCOUNTER — Encounter (HOSPITAL_COMMUNITY): Payer: Self-pay | Admitting: Emergency Medicine

## 2018-02-10 ENCOUNTER — Emergency Department (HOSPITAL_COMMUNITY)
Admission: EM | Admit: 2018-02-10 | Discharge: 2018-02-10 | Disposition: A | Discharge status: Home / Self Care | Payer: Medicaid Other | Attending: Emergency Medicine | Admitting: Emergency Medicine

## 2018-02-10 DIAGNOSIS — N764 Abscess of vulva: Secondary | ICD-10-CM | POA: Insufficient documentation

## 2018-02-10 DIAGNOSIS — F17210 Nicotine dependence, cigarettes, uncomplicated: Secondary | ICD-10-CM | POA: Insufficient documentation

## 2018-02-10 DIAGNOSIS — I10 Essential (primary) hypertension: Secondary | ICD-10-CM | POA: Insufficient documentation

## 2018-02-10 DIAGNOSIS — E119 Type 2 diabetes mellitus without complications: Secondary | ICD-10-CM | POA: Insufficient documentation

## 2018-02-10 DIAGNOSIS — Z7984 Long term (current) use of oral hypoglycemic drugs: Secondary | ICD-10-CM | POA: Insufficient documentation

## 2018-02-10 MED ORDER — AMOXICILLIN-POT CLAVULANATE 875-125 MG PO TABS: 1.0000 | ORAL_TABLET | Freq: Two times a day (BID) | ORAL | 0 refills | Status: AC

## 2018-02-10 NOTE — ED Triage Notes (Signed)
Onset 2 days ago developed and abscess right groin. States had one in the past 2 months and was seen at [facility]. Pain currently 10/10 throbbing.

## 2018-02-10 NOTE — Discharge Instructions (Addendum)
Your abscess is actively draining, no I &D was indicated today. We discussed the risks and benefits of the procedure. We will treat your abscess with antibiotics, I have prescribed this for you. Please take 1 tablet twice a day for the next 7 days. I have given a referral for OB and general surgery to further manage your recurrent labial abscess.

## 2018-02-10 NOTE — ED Provider Notes (Signed)
[facility] EMERGENCY DEPARTMENT Provider Note   CSN: [id number] Arrival date & time: 02/10/18  1022     History   Chief Complaint Chief Complaint  Patient presents with  . Abscess    HPI Elizabeth Steele is a 53 y.o. female.  53 y.o female with a PMH of DM, Anxiety presents to the ED with a chief complaint of right groin abscess x 2 days. Patient reports treated in November for right groin abscess with I&D and Augmentin.  She reports abscess has now return, she reports tenderness to the area.  Patient reports waking up this morning and having her underwear soaked in the region of her abscess as the wound was actively draining.  Denies taking any medication for relieving symptoms or applying any topical medications to the area.  She denies any fever, body with urinating, or trauma.     Past Medical History:  Diagnosis Date  . Abnormal Pap smear   . Allergy   . Anemia   . Anxiety   . Arthritis   . Blood transfusion without reported diagnosis   . Boil   . Diabetes mellitus without complication ([place])   . Fibroids   . Goiter   . Hypertension    takes hctz  . MRSA (methicillin resistant Staphylococcus aureus)    2005  . Ovarian cyst   . PID (pelvic inflammatory disease)   . Substance abuse ([place])   . Thyroid disease    has a goiter, doesn't need medical management  . Urinary tract infection     Patient Active Problem List   Diagnosis Date Noted  . Fibroid uterus 12/11/2010  . Abnormal uterine bleeding 12/11/2010  . HTN (hypertension) 12/11/2010    Past Surgical History:  Procedure Laterality Date  . ABDOMINAL HYSTERECTOMY    . COLPOSCOPY    . CYSTOSCOPY  01/22/2011   Procedure: CYSTOSCOPY;  Surgeon: Myra C. Hulan Fray, MD;  Location: [facility];  Service: Gynecology;  Laterality: N/A;  . DILATION AND CURETTAGE OF UTERUS    . INDUCED ABORTION     x 3  . KNEE ARTHROSCOPY    . SALIVARY STONE REMOVAL     gland removed right     OB History    Gravida    5   Para  2   Term  1   Preterm  1   AB  3   Living  1     SAB      TAB  3   Ectopic      Multiple      Live Births  1            Home Medications    Prior to Admission medications   Medication Sig Start Date End Date Taking? Authorizing Provider  amLODipine (NORVASC) 10 MG tablet Take 10 mg by mouth daily. 11/17/17   [provider]  amoxicillin-clavulanate (AUGMENTIN) 875-125 MG tablet Take 1 tablet by mouth 2 (two) times daily for 7 days. 02/10/18 02/17/18  Janeece Fitting, PA-C  BAYER MICROLET LANCETS lancets Use as instructed 10/14/12   Luvenia Redden, PA-C  glucose blood (BAYER CONTOUR NEXT TEST) test strip Use as instructed 10/14/12   Luvenia Redden, PA-C  hydrALAZINE (APRESOLINE) 25 MG tablet Take 25 mg by mouth 3 (three) times daily.    [provider]  hydrochlorothiazide (HYDRODIURIL) 12.5 MG tablet Take 12.5 mg by mouth daily. 11/17/17   [provider]  HYDROcodone-acetaminophen (NORCO/VICODIN) 5-325 MG tablet  Take 1 tablet by mouth every 6 (six) hours as needed. 11/19/17   Henderly, Britni A, PA-C  metFORMIN (GLUCOPHAGE) 500 MG tablet Take 1 tablet (500 mg total) by mouth 2 (two) times daily with a meal. 06/01/12   Blanchie Dessert, MD  traZODone (DESYREL) 50 MG tablet Take 50 mg by mouth at bedtime. 09/19/17   [provider]    Family History Family History  Problem Relation Age of Onset  . Cancer Father        throat  . Esophageal cancer Father   . Cancer Brother        leukemia  . Cancer Daughter        breast  . Diabetes Mother   . Diabetes Paternal Aunt   . Thyroid disease Maternal Grandmother   . Anesthesia problems Neg Hx   . Colon cancer Neg Hx   . Liver cancer Neg Hx   . Pancreatic cancer Neg Hx   . Rectal cancer Neg Hx   . Stomach cancer Neg Hx     Social History Social History   Tobacco Use  . Smoking status: Current Every Day Smoker    Packs/day: 0.50    Years: 30.00    Pack years: 15.00     Types: Cigarettes  . Smokeless tobacco: Never Used  Substance Use Topics  . Alcohol use: No  . Drug use: Not Currently    Types: Cocaine    Comment: Pt has been clean 14 years     Allergies   Shellfish allergy   Review of Systems Review of Systems  Constitutional: Negative for fever.  Skin: Positive for wound. Negative for rash.     Physical Exam Updated Vital Signs BP 138/89   Pulse 96   Temp 97.7 F (36.5 C) (Oral)   Resp 16   Ht 5\' 5"  (1.651 m)   Wt 80.3 kg   LMP 11/18/2010   SpO2 98%   BMI 29.45 kg/m   Physical Exam Vitals signs and nursing note reviewed. Exam conducted with a chaperone present.  Constitutional:      General: She is not in acute distress.    Appearance: She is well-developed.  HENT:     Head: Normocephalic and atraumatic.     Mouth/Throat:     Pharynx: No oropharyngeal exudate.  Eyes:     Pupils: Pupils are equal, round, and reactive to light.  Neck:     Musculoskeletal: Normal range of motion.  Cardiovascular:     Rate and Rhythm: Regular rhythm.     Heart sounds: Normal heart sounds.  Pulmonary:     Effort: Pulmonary effort is normal. No respiratory distress.     Breath sounds: Normal breath sounds.  Abdominal:     General: Bowel sounds are normal. There is no distension.     Palpations: Abdomen is soft.     Tenderness: There is no abdominal tenderness.  Genitourinary:    Exam position: Supine.     Pubic Area: No rash or pubic lice.      Labia:        Right: Tenderness present.        Left: No rash, tenderness or lesion.     Musculoskeletal:        General: No tenderness or deformity.     Right lower leg: No edema.     Left lower leg: No edema.  Skin:    General: Skin is warm and dry.  Neurological:  Mental Status: She is alert and oriented to person, place, and time.      ED Treatments / Results  Labs (all labs ordered are listed, but only abnormal results are displayed) Labs Reviewed - No data to  display  EKG None  Radiology No results found.  Procedures Procedures (including critical care time)  Medications Ordered in ED Medications - No data to display   Initial Impression / Assessment and Plan / ED Course  I have reviewed the triage vital signs and the nursing notes.  Pertinent labs & imaging results that were available during my care of the patient were reviewed by me and considered in my medical decision making (see chart for details).    Patient presents with recurrent labial abscess to the right labia. She reports this is recurrent as she had a previous abscess drained in November. Patient states no medical therapy for relieve in symptoms. She reports waking up with a soaked under garment due to abscess draining in the are. Bedside ultrasound performed by me reveal small amount of fluctuance, we have discussed the risk and benefits. She is opting for antibiotic treatment and surgery referral as this is recurrent.   No swelling to the labia minora, no tenderness or abnormality low suspicion for bartholin's cyst. Will patient with Bactrim as she is currently diabetic.  She is also requesting a referral for surgery will provide her with a referral for general surgery and OB.  Patient understands and agrees with management she is due to return for recheck if symptoms worsen or abscess stops draining. Return Precautions provided at length.  Final Clinical Impressions(s) / ED Diagnoses   Final diagnoses:  Labial abscess    ED Discharge Orders         Ordered    amoxicillin-clavulanate (AUGMENTIN) 875-125 MG tablet  2 times daily     02/10/18 [address] PA-C 02/10/18 1118    Isla Pence, MD 02/10/18 1122

## 2018-07-21 ENCOUNTER — Other Ambulatory Visit: Payer: Self-pay | Admitting: Dentistry

## 2018-07-21 ENCOUNTER — Ambulatory Visit
Admission: RE | Admit: 2018-07-21 | Discharge: 2018-07-21 | Disposition: A | Discharge status: Home / Self Care | Payer: Disability Insurance | Source: Ambulatory Visit | Attending: Dentistry | Admitting: Dentistry

## 2018-07-21 DIAGNOSIS — M4800 Spinal stenosis, site unspecified: Secondary | ICD-10-CM

## 2019-01-12 DIAGNOSIS — Z8616 Personal history of COVID-19: Secondary | ICD-10-CM

## 2019-01-12 HISTORY — DX: Personal history of COVID-19: Z86.16

## 2020-07-18 ENCOUNTER — Encounter: Payer: Self-pay | Admitting: Emergency Medicine

## 2020-07-18 ENCOUNTER — Ambulatory Visit
Admission: EM | Admit: 2020-07-18 | Discharge: 2020-07-18 | Disposition: A | Discharge status: Home / Self Care | Payer: BC Managed Care – PPO | Attending: Internal Medicine | Admitting: Internal Medicine

## 2020-07-18 ENCOUNTER — Other Ambulatory Visit: Payer: Self-pay

## 2020-07-18 DIAGNOSIS — M4802 Spinal stenosis, cervical region: Secondary | ICD-10-CM

## 2020-07-18 DIAGNOSIS — E119 Type 2 diabetes mellitus without complications: Secondary | ICD-10-CM | POA: Diagnosis not present

## 2020-07-18 DIAGNOSIS — I10 Essential (primary) hypertension: Secondary | ICD-10-CM

## 2020-07-18 LAB — POCT FASTING CBG KUC MANUAL ENTRY: POCT Glucose (KUC): 119 mg/dL — AB (ref 70–99)

## 2020-07-18 MED ORDER — AMLODIPINE BESYLATE 10 MG PO TABS: 10.0000 mg | ORAL_TABLET | Freq: Every day | ORAL | 2 refills | Status: AC

## 2020-07-18 MED ORDER — TRAZODONE HCL 50 MG PO TABS: 100.0000 mg | ORAL_TABLET | Freq: Every day | ORAL | 2 refills | Status: AC

## 2020-07-18 MED ORDER — HYDROCHLOROTHIAZIDE 12.5 MG PO TABS: 25.0000 mg | ORAL_TABLET | Freq: Every day | ORAL | 2 refills | Status: AC

## 2020-07-18 MED ORDER — METFORMIN HCL 500 MG PO TABS: 1000.0000 mg | ORAL_TABLET | Freq: Two times a day (BID) | ORAL | 2 refills | Status: DC

## 2020-07-18 MED ORDER — METFORMIN HCL 500 MG PO TABS: 1000.0000 mg | ORAL_TABLET | Freq: Two times a day (BID) | ORAL | 2 refills | Status: AC

## 2020-07-18 NOTE — ED Triage Notes (Signed)
Pt presents today requesting medication refills. She reports just relocating back to [facility] and has been looking for PCP.

## 2020-07-18 NOTE — ED Provider Notes (Signed)
Elizabeth Steele    CSN: [id number] Arrival date & time: 07/18/20  1354      History   Chief Complaint Chief Complaint  Patient presents with   Medication Refill    HPI Elizabeth Steele is a 55 y.o. female.   Patient presents to the urgent [facility] today for medication refill for amlodipine, hydrochlorothiazide, trazodone, metformin, Lyrica.  Patient states that she has been taking these medications for multiple years and is tolerating well.  Patient just recently moved from [place] and is in need of a new primary Steele physician.  Patient has been out of medications for approximately 1 week.  Patient has paperwork from previous primary Steele visit in February in [place] that has dosages of current medications as well as most recent blood work.  Most recent blood work was conducted in February at this visit.  Last hemoglobin A1c was 7.2.  Patient has not been monitoring blood glucose at home because she ran out of lancets and has to order more.  Patient states that she has had occasional dizziness which typically happens when she does not take her blood pressure medication.  Blood pressure stable in clinic today.  Denies any nausea, vomiting, blurred vision, headache.  Patient states that she takes trazodone most nights to aid with sleep.  Patient takes Lyrica due to self-reported diagnosis of cervical spinal stenosis with pain that radiates down left arm.   Medication Refill  Past Medical History:  Diagnosis Date   Abnormal Pap smear    Allergy    Anemia    Anxiety    Arthritis    Blood transfusion without reported diagnosis    Boil    Diabetes mellitus without complication ([place])    Fibroids    Goiter    Hypertension    takes hctz   MRSA (methicillin resistant Staphylococcus aureus)    2005   Ovarian cyst    PID (pelvic inflammatory disease)    Substance abuse ([place])    Thyroid disease    has a goiter, doesn't need medical management   Urinary tract infection      Patient Active Problem List   Diagnosis Date Noted   Fibroid uterus 12/11/2010   Abnormal uterine bleeding 12/11/2010   HTN (hypertension) 12/11/2010    Past Surgical History:  Procedure Laterality Date   ABDOMINAL HYSTERECTOMY     COLPOSCOPY     CYSTOSCOPY  01/22/2011   Procedure: CYSTOSCOPY;  Surgeon: Myra C. Hulan Fray, MD;  Location: [facility];  Service: Gynecology;  Laterality: N/A;   DILATION AND CURETTAGE OF UTERUS     INDUCED ABORTION     x 3   KNEE ARTHROSCOPY     SALIVARY STONE REMOVAL     gland removed right    OB History     Gravida  5   Para  2   Term  1   Preterm  1   AB  3   Living  1      SAB      IAB  3   Ectopic      Multiple      Live Births  1            Home Medications    Prior to Admission medications   Medication Sig Start Date End Date Taking? Authorizing Provider  amLODipine (NORVASC) 10 MG tablet Take 1 tablet (10 mg total) by mouth daily. 07/18/20   Odis Luster, FNP  BAYER MICROLET LANCETS lancets  Use as instructed 10/14/12   Luvenia Redden, PA-C  glucose blood (BAYER CONTOUR NEXT TEST) test strip Use as instructed 10/14/12   Luvenia Redden, PA-C  hydrochlorothiazide (HYDRODIURIL) 12.5 MG tablet Take 2 tablets (25 mg total) by mouth daily. 07/18/20   Odis Luster, FNP  metFORMIN (GLUCOPHAGE) 500 MG tablet Take 2 tablets (1,000 mg total) by mouth 2 (two) times daily with a meal. 07/18/20   Odis Luster, FNP  traZODone (DESYREL) 50 MG tablet Take 2 tablets (100 mg total) by mouth at bedtime. 07/18/20   Odis Luster, FNP    Family History Family History  Problem Relation Age of Onset   Cancer Father        throat   Esophageal cancer Father    Cancer Brother        leukemia   Cancer Daughter        breast   Diabetes Mother    Diabetes Paternal Aunt    Thyroid disease Maternal Grandmother    Anesthesia problems Neg Hx    Colon cancer Neg Hx    Liver cancer Neg Hx    Pancreatic cancer Neg Hx    Rectal cancer Neg  Hx    Stomach cancer Neg Hx     Social History Social History   Tobacco Use   Smoking status: Every Day    Packs/day: 0.50    Years: 30.00    Pack years: 15.00    Types: Cigarettes   Smokeless tobacco: Never  Substance Use Topics   Alcohol use: No   Drug use: Not Currently    Types: Cocaine    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 --      Excl. in [place]? --    No data found.  Updated Vital Signs BP 135/88 (BP Location: Right Arm)   Pulse 85   Temp 98.1 F (36.7 C) (Oral)   Resp 18   LMP 11/18/2010   SpO2 96%   Visual Acuity Right Eye Distance:   Left Eye Distance:   Bilateral Distance:    Right Eye Near:   Left Eye Near:    Bilateral Near:     Physical Exam Constitutional:      General: She is not in acute distress.    Appearance: Normal appearance.  HENT:     Head: Normocephalic and atraumatic.  Eyes:     Extraocular Movements: Extraocular movements intact.     Conjunctiva/sclera: Conjunctivae normal.  Cardiovascular:     Rate and Rhythm: Normal rate and regular rhythm.     Pulses: Normal pulses.     Heart sounds: Normal heart sounds.  Pulmonary:  Effort: Pulmonary effort is normal.     Breath sounds: Normal breath sounds.  Abdominal:     General: Abdomen is flat. Bowel sounds are normal. There is no distension.     Palpations: Abdomen is soft.     Tenderness: There is no abdominal tenderness.  Skin:    General: Skin is warm and dry.  Neurological:      General: No focal deficit present.     Mental Status: She is alert and oriented to person, place, and time. Mental status is at baseline.  Psychiatric:        Mood and Affect: Mood normal.        Behavior: Behavior normal.        Thought Content: Thought content normal.        Judgment: Judgment normal.     UC Treatments / Results  Labs (all labs ordered are listed, but only abnormal results are displayed) Labs Reviewed  POCT FASTING CBG [facility] MANUAL ENTRY - Abnormal; Notable for the following components:      Result Value   POCT Glucose ([facility]) 119 (*)    All other components within normal limits  COMPREHENSIVE METABOLIC PANEL  CBC  HEMOGLOBIN A1C    EKG   Radiology No results found.  Procedures Procedures (including critical Steele time)  Medications Ordered in UC Medications - No data to display  Initial Impression / Assessment and Plan / UC Course  I have reviewed the triage vital signs and the nursing notes.  Pertinent labs & imaging results that were available during my [facility] of the patient were reviewed by me and considered in my medical decision making (see chart for details).     Amlodipine, metformin, hydrochlorothiazide, trazodone refilled per most recent dosage per paper the patient has from most recent primary Steele visit in February.  See scanned images.  Discussed with patient inability to prescribe Lyrica due to safety issues.  PCP assistance requested for patient.  CMP, CBC, hemoglobin A1c pending.  Patient advised to monitor blood glucose and blood pressure at home.Discussed strict return precautions. Patient verbalized understanding and is agreeable with plan.  Final Clinical Impressions(s) / UC Diagnoses   Final diagnoses:  Type 2 diabetes mellitus without complication, without long-term current use of insulin ([place])  Essential hypertension  Spinal stenosis in cervical region  Hypertension, unspecified type     Discharge Instructions      Amlodipine,  hydrochlorothiazide, metformin, trazodone have been refilled for you today.  PCP assistance has been requested for you.  Someone will reach out to you and assist you in finding a new primary Steele physician.  Please monitor blood pressure and blood glucose at home.     ED Prescriptions     Medication Sig Dispense Auth. Provider   amLODipine (NORVASC) 10 MG tablet Take 1 tablet (10 mg total) by mouth daily. 30 tablet Odis Luster, FNP   metFORMIN (GLUCOPHAGE) 500 MG tablet  (Status: Discontinued) Take 2 tablets (1,000 mg total) by mouth 2 (two) times daily with a meal. 60 tablet Odis Luster, FNP   hydrochlorothiazide (HYDRODIURIL) 12.5 MG tablet Take 2 tablets (25 mg total) by mouth daily. 30 tablet Odis Luster, FNP   traZODone (DESYREL) 50 MG tablet Take 2 tablets (100 mg total) by mouth at bedtime. 30 tablet Odis Luster, FNP   metFORMIN (GLUCOPHAGE) 500 MG tablet Take 2 tablets (1,000 mg total) by mouth 2 (two) times daily with a meal. 60 tablet Phillips Odor  E, FNP      PDMP not reviewed this encounter.   Odis Luster, FNP 07/18/20 1700

## 2020-07-18 NOTE — Discharge Instructions (Addendum)
Amlodipine, hydrochlorothiazide, metformin, trazodone have been refilled for you today.  PCP assistance has been requested for you.  Someone will reach out to you and assist you in finding a new primary care physician.  Please monitor blood pressure and blood glucose at home.

## 2020-07-19 LAB — COMPREHENSIVE METABOLIC PANEL
ALT: 13 IU/L (ref 0–32)
AST: 17 IU/L (ref 0–40)
Albumin/Globulin Ratio: 2.3 — ABNORMAL HIGH (ref 1.2–2.2)
Albumin: 4.4 g/dL (ref 3.8–4.9)
Alkaline Phosphatase: 84 IU/L (ref 44–121)
BUN/Creatinine Ratio: 24 — ABNORMAL HIGH (ref 9–23)
BUN: 13 mg/dL (ref 6–24)
Bilirubin Total: <0.2 mg/dL (ref 0.0–1.2)
CO2: 18 mmol/L — ABNORMAL LOW (ref 20–29)
Calcium: 9.7 mg/dL (ref 8.7–10.2)
Chloride: 106 mmol/L (ref 96–106)
Creatinine, Ser: 0.54 mg/dL — ABNORMAL LOW (ref 0.57–1.00)
Globulin, Total: 1.9 g/dL (ref 1.5–4.5)
Glucose: 105 mg/dL — ABNORMAL HIGH (ref 65–99)
Potassium: 4 mmol/L (ref 3.5–5.2)
Sodium: 142 mmol/L (ref 134–144)
Total Protein: 6.3 g/dL (ref 6.0–8.5)
eGFR: 109 mL/min/{1.73_m2} (ref >59)

## 2020-07-19 LAB — CBC
Hematocrit: 41.4 % (ref 34.0–46.6)
Hemoglobin: 14 g/dL (ref 11.1–15.9)
MCH: 30.8 pg (ref 26.6–33.0)
MCHC: 33.8 g/dL (ref 31.5–35.7)
MCV: 91 fL (ref 79–97)
Platelets: 173 10*3/uL (ref 150–450)
RBC: 4.54 x10E6/uL (ref 3.77–5.28)
RDW: 13 % (ref 11.7–15.4)
WBC: 6.8 10*3/uL (ref 3.4–10.8)

## 2020-07-19 LAB — HEMOGLOBIN A1C
Est. average glucose Bld gHb Est-mCnc: 146 mg/dL
Hgb A1c MFr Bld: 6.7 % — ABNORMAL HIGH (ref 4.8–5.6)

## 2021-01-05 ENCOUNTER — Other Ambulatory Visit: Payer: Self-pay

## 2021-01-05 ENCOUNTER — Emergency Department (HOSPITAL_COMMUNITY): Payer: BC Managed Care – PPO

## 2021-01-05 ENCOUNTER — Emergency Department (HOSPITAL_COMMUNITY)
Admission: EM | Admit: 2021-01-05 | Discharge: 2021-01-06 | Disposition: A | Discharge status: Home / Self Care | Payer: BC Managed Care – PPO | Attending: Emergency Medicine | Admitting: Emergency Medicine

## 2021-01-05 ENCOUNTER — Encounter (HOSPITAL_COMMUNITY): Payer: Self-pay

## 2021-01-05 DIAGNOSIS — K839 Disease of biliary tract, unspecified: Secondary | ICD-10-CM | POA: Insufficient documentation

## 2021-01-05 DIAGNOSIS — Z79899 Other long term (current) drug therapy: Secondary | ICD-10-CM | POA: Diagnosis not present

## 2021-01-05 DIAGNOSIS — F17210 Nicotine dependence, cigarettes, uncomplicated: Secondary | ICD-10-CM | POA: Insufficient documentation

## 2021-01-05 DIAGNOSIS — E119 Type 2 diabetes mellitus without complications: Secondary | ICD-10-CM | POA: Insufficient documentation

## 2021-01-05 DIAGNOSIS — N23 Unspecified renal colic: Secondary | ICD-10-CM

## 2021-01-05 DIAGNOSIS — K838 Other specified diseases of biliary tract: Secondary | ICD-10-CM

## 2021-01-05 DIAGNOSIS — I10 Essential (primary) hypertension: Secondary | ICD-10-CM | POA: Diagnosis not present

## 2021-01-05 DIAGNOSIS — Z7984 Long term (current) use of oral hypoglycemic drugs: Secondary | ICD-10-CM | POA: Insufficient documentation

## 2021-01-05 DIAGNOSIS — R1011 Right upper quadrant pain: Secondary | ICD-10-CM

## 2021-01-05 LAB — CBC WITH DIFFERENTIAL/PLATELET
Abs Immature Granulocytes: 0.04 10*3/uL (ref 0.00–0.07)
Basophils Absolute: 0 10*3/uL (ref 0.0–0.1)
Basophils Relative: 0 %
Eosinophils Absolute: 0 10*3/uL (ref 0.0–0.5)
Eosinophils Relative: 0 %
HCT: 43.2 % (ref 36.0–46.0)
Hemoglobin: 14.5 g/dL (ref 12.0–15.0)
Immature Granulocytes: 0 %
Lymphocytes Relative: 8 %
Lymphs Abs: 1 10*3/uL (ref 0.7–4.0)
MCH: 31.5 pg (ref 26.0–34.0)
MCHC: 33.6 g/dL (ref 30.0–36.0)
MCV: 93.7 fL (ref 80.0–100.0)
Monocytes Absolute: 0.3 10*3/uL (ref 0.1–1.0)
Monocytes Relative: 3 %
Neutro Abs: 10.9 10*3/uL — ABNORMAL HIGH (ref 1.7–7.7)
Neutrophils Relative %: 89 %
Platelets: 181 10*3/uL (ref 150–400)
RBC: 4.61 MIL/uL (ref 3.87–5.11)
RDW: 13.2 % (ref 11.5–15.5)
WBC: 12.3 10*3/uL — ABNORMAL HIGH (ref 4.0–10.5)
nRBC: 0 % (ref 0.0–0.2)

## 2021-01-05 LAB — COMPREHENSIVE METABOLIC PANEL
ALT: 16 U/L (ref 0–44)
AST: 20 U/L (ref 15–41)
Albumin: 4.6 g/dL (ref 3.5–5.0)
Alkaline Phosphatase: 75 U/L (ref 38–126)
Anion gap: 9 (ref 5–15)
BUN: 12 mg/dL (ref 6–20)
CO2: 28 mmol/L (ref 22–32)
Calcium: 9.6 mg/dL (ref 8.9–10.3)
Chloride: 104 mmol/L (ref 98–111)
Creatinine, Ser: 0.71 mg/dL (ref 0.44–1.00)
GFR, Estimated: >60 mL/min (ref >60)
Glucose, Bld: 180 mg/dL — ABNORMAL HIGH (ref 70–99)
Potassium: 4 mmol/L (ref 3.5–5.1)
Sodium: 141 mmol/L (ref 135–145)
Total Bilirubin: 0.5 mg/dL (ref 0.3–1.2)
Total Protein: 7.3 g/dL (ref 6.5–8.1)

## 2021-01-05 LAB — URINALYSIS, ROUTINE W REFLEX MICROSCOPIC
Bilirubin Urine: NEGATIVE
Glucose, UA: NEGATIVE mg/dL
Ketones, ur: 15 mg/dL — AB
Leukocytes,Ua: NEGATIVE
Nitrite: NEGATIVE
Protein, ur: NEGATIVE mg/dL
Specific Gravity, Urine: 1.02 (ref 1.005–1.030)
pH: 6 (ref 5.0–8.0)

## 2021-01-05 LAB — LIPASE, BLOOD: Lipase: 63 U/L — ABNORMAL HIGH (ref 11–51)

## 2021-01-05 MED ORDER — OXYCODONE-ACETAMINOPHEN 5-325 MG PO TABS
1.0000 | ORAL_TABLET | Freq: Once | ORAL | Status: AC
  Administered 2021-01-05: 21:00:00 1 via ORAL
  Filled 2021-01-05: qty 1

## 2021-01-05 MED ORDER — ONDANSETRON 4 MG PO TBDP
4.0000 mg | ORAL_TABLET | Freq: Once | ORAL | Status: AC
  Administered 2021-01-05: 21:00:00 4 mg via ORAL
  Filled 2021-01-05: qty 1

## 2021-01-05 NOTE — ED Triage Notes (Signed)
Pt reports with RUQ abdominal pain since 11 am today. Pt reports episodes of vomiting and diarrhea.

## 2021-01-05 NOTE — ED Provider Notes (Signed)
Emergency Medicine Provider Triage Evaluation Note  Elizabeth Steele , a 55 y.o. female  was evaluated in triage.  Pt complains of right upper quadrant abdominal pain.  Pain started this morning approximately 1 hour after eating.  Pain has been constant since then however has gradually improved.  Endorses nausea and vomiting.  States she had 3 episodes of vomiting describes emesis as stomach contents.  Patient reports similar episode of pain in November, was not evaluated at that time.  Additionally patient complains of dysuria and urinary urgency x1 month  Patient states that she had a fever and chills starting earlier today as well.  Review of Systems  Positive: Fever, chills, nausea, vomiting, right upper quadrant abdominal pain, dysuria, urinary urgency Negative: Constipation, diarrhea, blood in stool, melena, hematemesis, coffee-ground emesis, hematuria, urinary frequency,  Physical Exam  BP (!) 149/96 (BP Location: Left Arm)    Pulse 92    Temp 98.4 F (36.9 C) (Oral)    Resp 16    Ht 5\' 5"  (1.651 m)    Wt 81.6 kg    LMP 11/18/2010    SpO2 96%    BMI 29.95 kg/m  Gen:   Awake, no distress   Resp:  Normal effort  MSK:   Moves extremities without difficulty  Other:  Abdomen soft, nondistended, tenderness to right upper quadrant.  No guarding or rebound tenderness.  Positive Murphy sign.  Medical Decision Making  Medically screening exam initiated at 8:48 PM.  Appropriate orders placed.  Bing Neighbors was informed that the remainder of the evaluation will be completed by another provider, this initial triage assessment does not replace that evaluation, and the importance of remaining in the ED until their evaluation is complete.  Will obtain CT abdomen pelvis to evaluate for possible biliary dysfunction as right upper quadrant ultrasound is unavailable at this time.   Loni Beckwith, PA-C 01/05/21 2051    Jeanell Sparrow, DO 01/08/21 [phone]

## 2021-01-06 ENCOUNTER — Emergency Department (HOSPITAL_COMMUNITY): Payer: BC Managed Care – PPO

## 2021-01-06 ENCOUNTER — Encounter (HOSPITAL_COMMUNITY): Payer: Self-pay

## 2021-01-06 LAB — URINE CULTURE: Culture: NO GROWTH

## 2021-01-06 MED ORDER — TAMSULOSIN HCL 0.4 MG PO CAPS: 0.4000 mg | ORAL_CAPSULE | Freq: Two times a day (BID) | ORAL | 0 refills | Status: AC

## 2021-01-06 MED ORDER — OXYCODONE HCL 5 MG PO TABS: 5.0000 mg | ORAL_TABLET | Freq: Four times a day (QID) | ORAL | 0 refills | Status: DC | PRN

## 2021-01-06 MED ORDER — IOHEXOL 350 MG/ML SOLN
80.0000 mL | Freq: Once | INTRAVENOUS | Status: AC | PRN
  Administered 2021-01-06: 01:00:00 80 mL via INTRAVENOUS

## 2021-01-06 MED ORDER — ONDANSETRON HCL 4 MG PO TABS: 4.0000 mg | ORAL_TABLET | Freq: Three times a day (TID) | ORAL | 0 refills | Status: AC | PRN

## 2021-01-06 MED ORDER — SODIUM CHLORIDE (PF) 0.9 % IJ SOLN
INTRAMUSCULAR | Status: AC
  Filled 2021-01-06: qty 50

## 2021-01-06 MED ORDER — OXYCODONE HCL 5 MG PO TABS
10.0000 mg | ORAL_TABLET | Freq: Once | ORAL | Status: AC
  Administered 2021-01-06: 02:00:00 10 mg via ORAL
  Filled 2021-01-06: qty 2

## 2021-01-06 MED ORDER — ONDANSETRON 4 MG PO TBDP
4.0000 mg | ORAL_TABLET | Freq: Once | ORAL | Status: AC
  Administered 2021-01-06: 02:00:00 4 mg via ORAL
  Filled 2021-01-06: qty 1

## 2021-01-06 NOTE — Discharge Instructions (Signed)
1. Medications: Roxicodone, Flomax, Zofran, usual home medications 2. Treatment: rest, drink plenty of fluids,  3. Follow Up: Please followup with your primary doctor and/or urology in 2-3 days for discussion of your diagnoses. Please also follow-up with gastroenterology for further evaluation of your dilated bile duct; Please return to the ER for worsening pain, high fevers or persistent vomiting.

## 2021-01-06 NOTE — ED Provider Notes (Signed)
[facility] DEPT Provider Note   CSN: [id number] Arrival date & time: 01/05/21  1826     History Chief Complaint  Patient presents with   Abdominal Pain    Elizabeth Steele is a 55 y.o. female presents with right upper quadrant and right side abdominal pain onset around 2 PM.  Patient reports it was approximately 1 hour after eating.  She reports she did have nausea and vomiting.  She had 3 episodes of nonbloody nonbilious emesis.  She reports stomach contents only.  Patient states she had a similar episode in November but was not evaluated at that time.  Reports history of abdominal hysterectomy but no other abdominal surgeries.  Reports pain medication given in triage helped her pain significantly and she feels much better now.  She does endorse concerns for fevers and chills earlier today.  Denies known sick contacts.  She does report some dysuria and urinary urgency for 1 month.  No specific aggravating factors.  The history is provided by the patient and medical records. No language interpreter was used.      Past Medical History:  Diagnosis Date   Abnormal Pap smear    Allergy    Anemia    Anxiety    Arthritis    Blood transfusion without reported diagnosis    Boil    Diabetes mellitus without complication ([place])    Fibroids    Goiter    Hypertension    takes hctz   MRSA (methicillin resistant Staphylococcus aureus)    2005   Ovarian cyst    PID (pelvic inflammatory disease)    Substance abuse ([place])    Thyroid disease    has a goiter, doesn't need medical management   Urinary tract infection     Patient Active Problem List   Diagnosis Date Noted   Fibroid uterus 12/11/2010   Abnormal uterine bleeding 12/11/2010   HTN (hypertension) 12/11/2010    Past Surgical History:  Procedure Laterality Date   ABDOMINAL HYSTERECTOMY     COLPOSCOPY     CYSTOSCOPY  01/22/2011   Procedure: CYSTOSCOPY;  Surgeon: Myra C. Hulan Fray, MD;  Location: [facility]  ORS;  Service: Gynecology;  Laterality: N/A;   DILATION AND CURETTAGE OF UTERUS     INDUCED ABORTION     x 3   KNEE ARTHROSCOPY     SALIVARY STONE REMOVAL     gland removed right     OB History     Gravida  5   Para  2   Term  1   Preterm  1   AB  3   Living  1      SAB      IAB  3   Ectopic      Multiple      Live Births  1           Family History  Problem Relation Age of Onset   Cancer Father        throat   Esophageal cancer Father    Cancer Brother        leukemia   Cancer Daughter        breast   Diabetes Mother    Diabetes Paternal Aunt    Thyroid disease Maternal Grandmother    Anesthesia problems Neg Hx    Colon cancer Neg Hx    Liver cancer Neg Hx    Pancreatic cancer Neg Hx    Rectal cancer Neg Hx  Stomach cancer Neg Hx     Social History   Tobacco Use   Smoking status: Every Day    Packs/day: 0.50    Years: 30.00    Pack years: 15.00    Types: Cigarettes   Smokeless tobacco: Never  Substance Use Topics   Alcohol use: No   Drug use: Not Currently    Types: Cocaine    Comment: Pt has been clean 14 years    Home Medications Prior to Admission medications   Medication Sig Start Date End Date Taking? Authorizing Provider  ondansetron (ZOFRAN) 4 MG tablet Take 1 tablet (4 mg total) by mouth every 8 (eight) hours as needed for nausea or vomiting. 01/06/21  Yes Reis Pienta, Jarrett Soho, PA-C  oxyCODONE (ROXICODONE) 5 MG immediate release tablet Take 1 tablet (5 mg total) by mouth every 6 (six) hours as needed for severe pain. 01/06/21  Yes Abbigal Radich, Jarrett Soho, PA-C  tamsulosin (FLOMAX) 0.4 MG CAPS capsule Take 1 capsule (0.4 mg total) by mouth 2 (two) times daily. 01/06/21  Yes Adrianna Dudas, Jarrett Soho, PA-C  amLODipine (NORVASC) 10 MG tablet Take 1 tablet (10 mg total) by mouth daily. 07/18/20   Teodora Medici, FNP  BAYER MICROLET LANCETS lancets Use as instructed 10/14/12   Luvenia Redden, PA-C  glucose blood (BAYER CONTOUR NEXT TEST)  test strip Use as instructed 10/14/12   Luvenia Redden, PA-C  hydrochlorothiazide (HYDRODIURIL) 12.5 MG tablet Take 2 tablets (25 mg total) by mouth daily. 07/18/20   Teodora Medici, FNP  metFORMIN (GLUCOPHAGE) 500 MG tablet Take 2 tablets (1,000 mg total) by mouth 2 (two) times daily with a meal. 07/18/20   Teodora Medici, FNP  traZODone (DESYREL) 50 MG tablet Take 2 tablets (100 mg total) by mouth at bedtime. 07/18/20   Teodora Medici, FNP    Allergies    Shellfish allergy  Review of Systems   Review of Systems  Constitutional:  Negative for appetite change, diaphoresis, fatigue, fever and unexpected weight change.  HENT:  Negative for mouth sores.   Eyes:  Negative for visual disturbance.  Respiratory:  Negative for cough, chest tightness, shortness of breath and wheezing.   Cardiovascular:  Negative for chest pain.  Gastrointestinal:  Positive for abdominal pain, nausea and vomiting. Negative for constipation and diarrhea.  Endocrine: Negative for polydipsia, polyphagia and polyuria.  Genitourinary:  Negative for dysuria, frequency, hematuria and urgency.  Musculoskeletal:  Negative for back pain and neck stiffness.  Skin:  Negative for rash.  Allergic/Immunologic: Negative for immunocompromised state.  Neurological:  Negative for syncope, light-headedness and headaches.  Hematological:  Does not bruise/bleed easily.  Psychiatric/Behavioral:  Negative for sleep disturbance. The patient is not nervous/anxious.    Physical Exam Updated Vital Signs BP (!) 149/96 (BP Location: Left Arm)    Pulse 92    Temp 98.4 F (36.9 C) (Oral)    Resp 16    Ht 5\' 5"  (1.651 m)    Wt 81.6 kg    LMP 11/18/2010    SpO2 96%    BMI 29.95 kg/m   Physical Exam Vitals and nursing note reviewed.  Constitutional:      General: She is not in acute distress.    Appearance: She is not diaphoretic.  HENT:     Head: Normocephalic.  Eyes:     General: No scleral icterus.    Conjunctiva/sclera: Conjunctivae  normal.  Cardiovascular:     Rate and Rhythm: Normal rate and regular rhythm.  Pulses: Normal pulses.          Radial pulses are 2+ on the right side and 2+ on the left side.  Pulmonary:     Effort: Pulmonary effort is normal. No tachypnea, accessory muscle usage, prolonged expiration, respiratory distress or retractions.     Breath sounds: Normal breath sounds. No stridor.     Comments: Equal chest rise. No increased work of breathing. Abdominal:     General: Bowel sounds are normal. There is no distension.     Palpations: Abdomen is soft.     Tenderness: There is abdominal tenderness in the right upper quadrant. There is right CVA tenderness. There is no left CVA tenderness, guarding or rebound.  Musculoskeletal:     Cervical back: Normal range of motion.     Comments: Moves all extremities equally and without difficulty.  Skin:    General: Skin is warm and dry.     Capillary Refill: Capillary refill takes less than 2 seconds.  Neurological:     Mental Status: She is alert.     GCS: GCS eye subscore is 4. GCS verbal subscore is 5. GCS motor subscore is 6.     Comments: Speech is clear and goal oriented.  Psychiatric:        Mood and Affect: Mood normal.    ED Results / Procedures / Treatments   Labs (all labs ordered are listed, but only abnormal results are displayed) Labs Reviewed  COMPREHENSIVE METABOLIC PANEL - Abnormal; Notable for the following components:      Result Value   Glucose, Bld 180 (*)    All other components within normal limits  CBC WITH DIFFERENTIAL/PLATELET - Abnormal; Notable for the following components:   WBC 12.3 (*)    Neutro Abs 10.9 (*)    All other components within normal limits  URINALYSIS, ROUTINE W REFLEX MICROSCOPIC - Abnormal; Notable for the following components:   Color, Urine YELLOW (*)    APPearance CLEAR (*)    Hgb urine dipstick SMALL (*)    Ketones, ur 15 (*)    Bacteria, UA RARE (*)    All other components within normal  limits  LIPASE, BLOOD - Abnormal; Notable for the following components:   Lipase 63 (*)    All other components within normal limits  URINE CULTURE    EKG None  Radiology CT ABDOMEN PELVIS W CONTRAST  Result Date: 01/06/2021 CLINICAL DATA:  Acute abdominal pain. EXAM: CT ABDOMEN AND PELVIS WITH CONTRAST TECHNIQUE: Multidetector CT imaging of the abdomen and pelvis was performed using the standard protocol following bolus administration of intravenous contrast. CONTRAST:  24mL OMNIPAQUE IOHEXOL 350 MG/ML SOLN COMPARISON:  Ultrasound dated 01/06/2021. FINDINGS: Lower chest: The visualized lung bases are clear. No intra-abdominal free air or free fluid. Hepatobiliary: No focal liver abnormality is seen. No gallstones, gallbladder wall thickening, or biliary dilatation. Pancreas: Unremarkable. No pancreatic ductal dilatation or surrounding inflammatory changes. Spleen: Normal in size without focal abnormality. Adrenals/Urinary Tract: The adrenal glands are unremarkable. There is a 5 mm calculus at the right ureterovesical junction with mild right hydronephrosis. There is a 2 mm nonobstructing right renal upper pole calculus. The left kidney, left ureter, and urinary bladder appear unremarkable. Stomach/Bowel: There is colonic diverticulosis without active inflammatory changes. There is no bowel obstruction or active inflammation. The appendix is normal. Vascular/Lymphatic: Mild aortoiliac atherosclerotic disease. The IVC is unremarkable. No portal venous gas. There is no adenopathy. Reproductive: Hysterectomy.  No adnexal masses. Other: None  Musculoskeletal: Degenerative changes of the spine. No acute osseous pathology. IMPRESSION: 1. A 5 mm right UVJ calculus with mild right hydronephrosis. 2. A 2 mm nonobstructing right renal upper pole calculus. 3. Colonic diverticulosis. No bowel obstruction. Normal appendix. 4. Aortic Atherosclerosis (ICD10-I70.0). Electronically Signed   By: Anner Crete M.D.    On: 01/06/2021 01:10   US Abdomen Limited  Result Date: 01/06/2021 CLINICAL DATA:  Right upper quadrant abdominal pain. EXAM: ULTRASOUND ABDOMEN LIMITED RIGHT UPPER QUADRANT COMPARISON:  None. FINDINGS: Gallbladder: No gallstones or wall thickening visualized. No sonographic Murphy sign noted by sonographer. Common bile duct: Diameter: 9 mm. There is mild intrahepatic biliary ductal dilatation. No stone identified in the visualized CBD. A centrally occlusive stone or mass is not excluded. MRCP may provide better evaluation if clinically indicated. Liver: There is diffuse increased liver echogenicity most commonly seen in the setting of fatty infiltration. Superimposed inflammation or fibrosis is not excluded. Clinical correlation is recommended. Portal vein is patent on color Doppler imaging with normal direction of blood flow towards the liver. Other: None. IMPRESSION: 1. Fatty liver. 2. Mildly dilated biliary trees.  No gallstone. Electronically Signed   By: Anner Crete M.D.   On: 01/06/2021 00:10    Procedures Procedures   Medications Ordered in ED Medications  oxyCODONE (Oxy IR/ROXICODONE) immediate release tablet 10 mg (has no administration in time range)  ondansetron (ZOFRAN-ODT) disintegrating tablet 4 mg (has no administration in time range)  oxyCODONE-acetaminophen (PERCOCET/ROXICET) 5-325 MG per tablet 1 tablet (1 tablet Oral Given 01/05/21 2124)  ondansetron (ZOFRAN-ODT) disintegrating tablet 4 mg (4 mg Oral Given 01/05/21 2124)  iohexol (OMNIPAQUE) 350 MG/ML injection 80 mL (80 mLs Intravenous Contrast Given 01/06/21 0051)  sodium chloride (PF) 0.9 % injection (  Given by Other 01/06/21 0104)    ED Course  I have reviewed the triage vital signs and the nursing notes.  Pertinent labs & imaging results that were available during my care of the patient were reviewed by me and considered in my medical decision making (see chart for details).    MDM Rules/Calculators/A&P                           Patient presents with right upper quadrant abdominal pain, nausea and vomiting.  Questionable fever and chills earlier today.  Labs with elevated lipase.  Patient is not specifically tender in the epigastrium, but does have some RUQ abd pain and R CVA tenderness.  Leukocytosis is noted.  Urinalysis shows small amount of hemoglobin, ketones and rare bacteria.  No specific evidence of urinary tract infection as she is nitrite and leukocyte negative.  Right upper quadrant ultrasound does show dilated common bile duct.  No visible stone or mass.  We will proceed with CT scan for further evaluation.  CT with Right sided 5 mm UVJ calculus with mild right-sided hydronephrosis.  2 mm nonobstructing right renal upper pole calculus as well.  Given patient's symptoms I suspect this is the cause of her pain.  Patient has tolerated p.o. without difficulty.  No persistent vomiting.  Discussed findings of CT and ultrasound.  Discussed likely hood of symptoms being secondary to renal colic however did discuss concern of common bile dilatation and need for close follow-up with GI.  Patient states understanding and is in agreement with the plan.  BP 138/85    Pulse 76    Temp 98.4 F (36.9 C) (Oral)    Resp 18  Ht 5\' 5"  (1.651 m)    Wt 81.6 kg    LMP 11/18/2010    SpO2 98%    BMI 29.95 kg/m        Final Clinical Impression(s) / ED Diagnoses Final diagnoses:  Right upper quadrant abdominal pain  Renal colic on right side  Dilated bile duct    Rx / DC Orders ED Discharge Orders          Ordered    tamsulosin (FLOMAX) 0.4 MG CAPS capsule  2 times daily        01/06/21 0140    oxyCODONE (ROXICODONE) 5 MG immediate release tablet  Every 6 hours PRN        01/06/21 0140    ondansetron (ZOFRAN) 4 MG tablet  Every 8 hours PRN        01/06/21 0140             [place] Hanlon, Jarrett Soho, PA-C 01/06/21 0147    Mesner, Corene Cornea, MD 01/06/21 [phone]

## 2021-01-15 ENCOUNTER — Encounter (HOSPITAL_BASED_OUTPATIENT_CLINIC_OR_DEPARTMENT_OTHER): Payer: Self-pay | Admitting: Urology

## 2021-01-15 ENCOUNTER — Other Ambulatory Visit: Payer: Self-pay | Admitting: Urology

## 2021-01-15 ENCOUNTER — Other Ambulatory Visit: Payer: Self-pay

## 2021-01-15 DIAGNOSIS — K8020 Calculus of gallbladder without cholecystitis without obstruction: Secondary | ICD-10-CM

## 2021-01-15 HISTORY — DX: Calculus of gallbladder without cholecystitis without obstruction: K80.20

## 2021-01-15 NOTE — Progress Notes (Addendum)
Spoke w/ via phone for pre-op interview---pt Lab needs dos----   ekg per anesthesia, surgery orders need 2nd sign            Lab results------cbc with dif, lipase, cmet 01-05-2021 epic COVID test -----patient states asymptomatic no test needed Arrive at -------1230 pm 01-20-2021 NPO after MN NO Solid Food.  Clear liquids from MN until---1130 am Med rec completed Medications to take morning of surgery -----amlodipine, flexeril prn, lyrica prn Diabetic medication ----- Patient instructed no nail polish to be worn day of surgery Patient instructed to bring photo id and insurance card day of surgery Patient aware to have Driver (ride ) / caregiver    for 24 hours after surgery  niece Barnett Applebaum cell [phone] Patient Special Instructions -----no smoking 24 hours before surgery Pre-Op special Istructions -----none Patient verbalized understanding of instructions that were given at this phone interview. Patient denies shortness of breath, chest pain, fever, cough at this phone interview.

## 2021-01-19 NOTE — H&P (Signed)
/HPI: cc: urolithaisis   01/07/21: 56 year old woman with a history of urolithiasis presented to the ED with acute onset right flank pain found to have a 5 mm right UVJ calculus as well as a 2 mm nonobstructing right renal calculus. Patient had her first stone episode in her 69s. She remembers having to have ESWL for this. She has never had ureteroscopy or stent. She denies any fevers. She has had some nausea. She is taking oxycodone for pain which is not completely controlling it.   01/14/2021: Elizabeth Steele presents today for 1 week follow-up after diagnosis of a right sided 5 mm distal calculus as well as a small nonobstructing right renal calculus. She reports that she has not passed a stone. She continues to have right-sided pain and discomfort associated with urinary frequency and urgency. She is ready to proceed with definitive stone intervention in the form of ureteroscopy. She denies interval fevers and chills.     ALLERGIES: Shellfish    MEDICATIONS: Hydrochlorothiazide  Metformin Hcl  Amlodipine Besilate  Cephalexin 500 mg capsule 1 capsule PO QID  Oxycodone Hcl     GU PSH: None   NON-GU PSH: Hysterectomy Remove Salivary Stone     GU PMH: Renal calculus - 01/07/2021 Ureteral calculus - 01/07/2021      PMH Notes: Diabetes   NON-GU PMH: Hypertension Hyperthyroidism Hypothyroidism    FAMILY HISTORY: Tuberculosis - Mother   SOCIAL HISTORY: Marital Status: Single Current Smoking Status: Patient smokes occasionally.   Tobacco Use Assessment Completed: Used Tobacco in last 30 days? Drinks 1 caffeinated drink per day.    REVIEW OF SYSTEMS:    GU Review Female:   Patient reports frequent urination and hard to postpone urination. Patient denies burning /pain with urination, get up at night to urinate, leakage of urine, stream starts and stops, trouble starting your stream, have to strain to urinate, and being pregnant.  Gastrointestinal (Upper):   Patient denies nausea,  vomiting, and indigestion/ heartburn.  Gastrointestinal (Lower):   Patient denies diarrhea and constipation.  Constitutional:   Patient denies fever, night sweats, weight loss, and fatigue.  Skin:   Patient denies skin rash/ lesion and itching.  Eyes:   Patient denies blurred vision and double vision.  Musculoskeletal:   Patient denies back pain and joint pain.  Neurological:   Patient denies headaches and dizziness.  Psychologic:   Patient denies depression and anxiety.   Notes: Patient stated she didn't pass the stone yet but using the bathroom regular.    VITAL SIGNS:      01/14/2021 01:01 PM  Weight 183 lb / 83.01 kg  Height 65 in / 165.1 cm  BP 133/89 mmHg  Pulse 106 /min  Temperature 97.9 F / 36.6 C  BMI 30.4 kg/m   GU PHYSICAL EXAMINATION:      Notes: No CVA tenderness   MULTI-SYSTEM PHYSICAL EXAMINATION:    Constitutional: Well-nourished. No physical deformities. Normally developed. Good grooming.  Respiratory: No labored breathing, no use of accessory muscles.   Cardiovascular: Normal temperature, normal extremity pulses, no swelling, no varicosities.  Skin: No paleness, no jaundice, no cyanosis. No lesion, no ulcer, no rash.  Neurologic / Psychiatric: Oriented to time, oriented to place, oriented to person. No depression, no anxiety, no agitation.     Complexity of Data:  Source Of History:  Patient  Records Review:   Previous Doctor Records, Previous Patient Records  Urine Test Review:   Urinalysis, Urine Culture  X-Ray Review: C.T. Abdomen/Pelvis: Reviewed Films.  Reviewed Report. Discussed With Patient.     01/14/21  Urinalysis  Urine Appearance Slightly Cloudy   Urine Color Yellow   Urine Glucose Neg mg/dL  Urine Bilirubin Neg mg/dL  Urine Ketones Neg mg/dL  Urine Specific Gravity 1.025   Urine Blood Neg ery/uL  Urine pH 6.0   Urine Protein Trace mg/dL  Urine Urobilinogen 1.0 mg/dL  Urine Nitrites Neg   Urine Leukocyte Esterase Neg leu/uL  Urine  WBC/hpf 0 - 5/hpf   Urine RBC/hpf NS (Not Seen)   Urine Epithelial Cells 10 - 20/hpf   Urine Bacteria Rare (0-9/hpf)   Urine Mucous Present   Urine Yeast NS (Not Seen)   Urine Trichomonas Not Present   Urine Cystals NS (Not Seen)   Urine Casts NS (Not Seen)   Urine Sperm Not Present    PROCEDURES:          Urinalysis w/Scope Dipstick Dipstick Cont'd Micro  Color: Yellow Bilirubin: Neg mg/dL WBC/hpf: 0 - 5/hpf  Appearance: Slightly Cloudy Ketones: Neg mg/dL RBC/hpf: NS (Not Seen)  Specific Gravity: 1.025 Blood: Neg ery/uL Bacteria: Rare (0-9/hpf)  pH: 6.0 Protein: Trace mg/dL Cystals: NS (Not Seen)  Glucose: Neg mg/dL Urobilinogen: 1.0 mg/dL Casts: NS (Not Seen)    Nitrites: Neg Trichomonas: Not Present    Leukocyte Esterase: Neg leu/uL Mucous: Present      Epithelial Cells: 10 - 20/hpf      Yeast: NS (Not Seen)      Sperm: Not Present    ASSESSMENT:      ICD-10 Details  1 GU:   Ureteral calculus - N20.1 Right, Acute, Uncomplicated   PLAN:            Medications Stop Meds: Ketorolac Tromethamine 10 mg tablet 1 tablet PO TID PRN  Start: 01/07/2021  Stop: 01/14/2021   Discontinue: 01/14/2021  - Reason: The medication was too expensive.  Tamsulosin Hcl 0.4 mg capsule 2 capsule PO Q HS  Start: 01/07/2021  Stop: 01/17/2021   Discontinue: 01/14/2021  - Reason: The medication cycle was completed.            Orders Labs CULTURE, URINE          Schedule Return Visit/Planned Activity: Next Available Appointment - Schedule Surgery          Document Letter(s):  Created for Patient: Clinical Summary         Notes:   Urinalysis sent for precautionary culture today. She continues to be symptomatic from her right distal ureteral stone. She is not a good candidate for lithotripsy due to multiple phleboliths. Stone intervention was discussed in detail today. For ureteroscopy, the patient understands that there is a chance for a staged procedure. Patient also understands that there  is risk for bleeding, infection, injury to surrounding organs, and general risks of anesthesia. The patient also understands the placement of a stent and the risks of stent placement including, risk for infection, the risk for pain, and the risk for injury. She would like to proceed with ureteroscopy. Posting sheet was placed today. She understands to notify the clinic with passage of stone material or fevers, chills or any other concerns.

## 2021-01-20 ENCOUNTER — Encounter (HOSPITAL_BASED_OUTPATIENT_CLINIC_OR_DEPARTMENT_OTHER): Payer: Self-pay | Admitting: Urology

## 2021-01-20 ENCOUNTER — Other Ambulatory Visit: Payer: Self-pay

## 2021-01-20 ENCOUNTER — Ambulatory Visit (HOSPITAL_BASED_OUTPATIENT_CLINIC_OR_DEPARTMENT_OTHER): Payer: BC Managed Care – PPO | Admitting: Certified Registered"

## 2021-01-20 ENCOUNTER — Ambulatory Visit (HOSPITAL_BASED_OUTPATIENT_CLINIC_OR_DEPARTMENT_OTHER)
Admission: RE | Admit: 2021-01-20 | Discharge: 2021-01-20 | Disposition: A | Discharge status: Home / Self Care | Payer: BC Managed Care – PPO | Attending: Urology | Admitting: Urology

## 2021-01-20 ENCOUNTER — Encounter (HOSPITAL_BASED_OUTPATIENT_CLINIC_OR_DEPARTMENT_OTHER)
Admission: RE | Disposition: A | Discharge status: Home / Self Care | Payer: Self-pay | Source: Home / Self Care | Attending: Urology

## 2021-01-20 DIAGNOSIS — F17200 Nicotine dependence, unspecified, uncomplicated: Secondary | ICD-10-CM | POA: Insufficient documentation

## 2021-01-20 DIAGNOSIS — Z79899 Other long term (current) drug therapy: Secondary | ICD-10-CM | POA: Diagnosis not present

## 2021-01-20 DIAGNOSIS — N201 Calculus of ureter: Secondary | ICD-10-CM | POA: Insufficient documentation

## 2021-01-20 DIAGNOSIS — I10 Essential (primary) hypertension: Secondary | ICD-10-CM | POA: Insufficient documentation

## 2021-01-20 HISTORY — PX: CYSTOSCOPY/URETEROSCOPY/HOLMIUM LASER/STENT PLACEMENT: SHX6546

## 2021-01-20 HISTORY — DX: Calculus of kidney: N20.0

## 2021-01-20 LAB — GLUCOSE, CAPILLARY
Glucose-Capillary: 130 mg/dL — ABNORMAL HIGH (ref 70–99)
Glucose-Capillary: 147 mg/dL — ABNORMAL HIGH (ref 70–99)

## 2021-01-20 SURGERY — CYSTOSCOPY/URETEROSCOPY/HOLMIUM LASER/STENT PLACEMENT
Anesthesia: General | Site: Ureter | Laterality: Right

## 2021-01-20 MED ORDER — LACTATED RINGERS IV SOLN: INTRAVENOUS | Status: DC

## 2021-01-20 MED ORDER — CEFAZOLIN SODIUM-DEXTROSE 2-4 GM/100ML-% IV SOLN
INTRAVENOUS | Status: AC
  Filled 2021-01-20: qty 100

## 2021-01-20 MED ORDER — ONDANSETRON HCL 4 MG/2ML IJ SOLN
INTRAMUSCULAR | Status: AC
  Filled 2021-01-20: qty 2

## 2021-01-20 MED ORDER — ACETAMINOPHEN 500 MG PO TABS: 1000.0000 mg | ORAL_TABLET | Freq: Once | ORAL | Status: DC | PRN

## 2021-01-20 MED ORDER — OXYBUTYNIN CHLORIDE ER 10 MG PO TB24: 10.0000 mg | ORAL_TABLET | Freq: Once | ORAL | Status: DC

## 2021-01-20 MED ORDER — ONDANSETRON HCL 4 MG/2ML IJ SOLN
INTRAMUSCULAR | Status: DC | PRN
Start: 2021-01-20 — End: 2021-01-20
  Administered 2021-01-20: 4 mg via INTRAVENOUS

## 2021-01-20 MED ORDER — PROPOFOL 10 MG/ML IV BOLUS
INTRAVENOUS | Status: DC | PRN
  Administered 2021-01-20: 170 mg via INTRAVENOUS

## 2021-01-20 MED ORDER — FENTANYL CITRATE (PF) 100 MCG/2ML IJ SOLN
INTRAMUSCULAR | Status: AC
  Filled 2021-01-20: qty 2

## 2021-01-20 MED ORDER — CEFAZOLIN SODIUM-DEXTROSE 2-3 GM-%(50ML) IV SOLR
INTRAVENOUS | Status: DC | PRN
  Administered 2021-01-20: 2 g via INTRAVENOUS

## 2021-01-20 MED ORDER — ACETAMINOPHEN 500 MG PO TABS
ORAL_TABLET | ORAL | Status: AC
  Filled 2021-01-20: qty 2

## 2021-01-20 MED ORDER — IOHEXOL 300 MG/ML  SOLN
INTRAMUSCULAR | Status: DC | PRN
  Administered 2021-01-20: 10 mL via URETHRAL

## 2021-01-20 MED ORDER — ACETAMINOPHEN 160 MG/5ML PO SOLN: 1000.0000 mg | Freq: Once | ORAL | Status: DC | PRN

## 2021-01-20 MED ORDER — MIDAZOLAM HCL 5 MG/5ML IJ SOLN
INTRAMUSCULAR | Status: DC | PRN
Start: 2021-01-20 — End: 2021-01-20
  Administered 2021-01-20: 2 mg via INTRAVENOUS

## 2021-01-20 MED ORDER — OXYCODONE HCL 5 MG PO TABS: 5.0000 mg | ORAL_TABLET | Freq: Once | ORAL | Status: DC | PRN

## 2021-01-20 MED ORDER — OXYBUTYNIN CHLORIDE 5 MG PO TABS
ORAL_TABLET | ORAL | Status: AC
  Filled 2021-01-20: qty 2

## 2021-01-20 MED ORDER — LIDOCAINE 2% (20 MG/ML) 5 ML SYRINGE
INTRAMUSCULAR | Status: AC
  Filled 2021-01-20: qty 5

## 2021-01-20 MED ORDER — ACETAMINOPHEN 10 MG/ML IV SOLN: 1000.0000 mg | Freq: Once | INTRAVENOUS | Status: DC | PRN

## 2021-01-20 MED ORDER — 0.9 % SODIUM CHLORIDE (POUR BTL) OPTIME
TOPICAL | Status: DC | PRN
  Administered 2021-01-20: 500 mL

## 2021-01-20 MED ORDER — FENTANYL CITRATE (PF) 100 MCG/2ML IJ SOLN
25.0000 ug | INTRAMUSCULAR | Status: DC | PRN
  Administered 2021-01-20 (×2): 25 ug via INTRAVENOUS

## 2021-01-20 MED ORDER — OXYBUTYNIN CHLORIDE ER 10 MG PO TB24: 10.0000 mg | ORAL_TABLET | Freq: Every day | ORAL | 0 refills | Status: AC

## 2021-01-20 MED ORDER — LIDOCAINE 2% (20 MG/ML) 5 ML SYRINGE
INTRAMUSCULAR | Status: DC | PRN
Start: 2021-01-20 — End: 2021-01-20
  Administered 2021-01-20: 60 mg via INTRAVENOUS

## 2021-01-20 MED ORDER — OXYCODONE HCL 5 MG/5ML PO SOLN: 5.0000 mg | Freq: Once | ORAL | Status: DC | PRN

## 2021-01-20 MED ORDER — FENTANYL CITRATE (PF) 100 MCG/2ML IJ SOLN
INTRAMUSCULAR | Status: DC | PRN
Start: 2021-01-20 — End: 2021-01-20
  Administered 2021-01-20: 25 ug via INTRAVENOUS
  Administered 2021-01-20: 50 ug via INTRAVENOUS
  Administered 2021-01-20: 25 ug via INTRAVENOUS

## 2021-01-20 MED ORDER — DEXAMETHASONE SODIUM PHOSPHATE 10 MG/ML IJ SOLN
INTRAMUSCULAR | Status: AC
  Filled 2021-01-20: qty 1

## 2021-01-20 MED ORDER — OXYCODONE HCL 5 MG PO TABS: 5.0000 mg | ORAL_TABLET | Freq: Three times a day (TID) | ORAL | 0 refills | Status: AC | PRN

## 2021-01-20 MED ORDER — CEPHALEXIN 500 MG PO CAPS: 500.0000 mg | ORAL_CAPSULE | Freq: Two times a day (BID) | ORAL | 0 refills | Status: AC

## 2021-01-20 MED ORDER — DEXAMETHASONE SODIUM PHOSPHATE 10 MG/ML IJ SOLN
INTRAMUSCULAR | Status: DC | PRN
  Administered 2021-01-20: 10 mg via INTRAVENOUS

## 2021-01-20 MED ORDER — PROPOFOL 10 MG/ML IV BOLUS
INTRAVENOUS | Status: AC
  Filled 2021-01-20: qty 20

## 2021-01-20 MED ORDER — SODIUM CHLORIDE 0.9 % IR SOLN
Status: DC | PRN
  Administered 2021-01-20: 3000 mL

## 2021-01-20 MED ORDER — MIDAZOLAM HCL 2 MG/2ML IJ SOLN
INTRAMUSCULAR | Status: AC
  Filled 2021-01-20: qty 2

## 2021-01-20 SURGICAL SUPPLY — 18 items
BAG DRAIN URO-CYSTO SKYTR STRL (DRAIN) ×2 IMPLANT
BAG DRN UROCATH (DRAIN) ×1
CATH URET 5FR 28IN OPEN ENDED (CATHETERS) ×2 IMPLANT
CLOTH BEACON ORANGE TIMEOUT ST (SAFETY) ×2 IMPLANT
GLOVE SURG ENC MOIS LTX SZ6.5 (GLOVE) ×2 IMPLANT
GLOVE SURG NEOPR MICRO LF SZ6 (GLOVE) ×1 IMPLANT
GLOVE SURG UNDER POLY LF SZ6.5 (GLOVE) ×2 IMPLANT
GOWN STRL REUS W/TWL LRG LVL3 (GOWN DISPOSABLE) ×3 IMPLANT
GUIDEWIRE STR DUAL SENSOR (WIRE) ×2 IMPLANT
IV NS IRRIG 3000ML ARTHROMATIC (IV SOLUTION) ×2 IMPLANT
KIT TURNOVER CYSTO (KITS) ×2 IMPLANT
MANIFOLD NEPTUNE II (INSTRUMENTS) ×2 IMPLANT
NS IRRIG 500ML POUR BTL (IV SOLUTION) ×1 IMPLANT
PACK CYSTO (CUSTOM PROCEDURE TRAY) ×2 IMPLANT
SHEATH URETERAL 12FRX35CM (MISCELLANEOUS) ×1 IMPLANT
STENT URET 6FRX24 CONTOUR (STENTS) ×1 IMPLANT
TUBE CONNECTING 12X1/4 (SUCTIONS) ×2 IMPLANT
TUBING UROLOGY SET (TUBING) ×2 IMPLANT

## 2021-01-20 NOTE — Op Note (Signed)
Preoperative diagnosis: right ureteral calculus  Postoperative diagnosis: spontaneous passage right ureteral calculus  Procedure:  Cystoscopy Right diagnostic ureteroscopy right 56F x 24 ureteral stent placement - with tether right retrograde pyelography with interpretation  Surgeon: Jacalyn Lefevre, MD  Anesthesia: General  Complications: None  Intraoperative findings:  Normal urethra Bilateral orthotropic ureteral orifices right retrograde pyelography demonstrated appeared normal Bladder mucosa normal without masses   EBL: Minimal  Specimens: normal  Disposition of specimens: [facility] for stone analysis  Indication: Elizabeth Steele is a 56 y.o.   patient with a 61mm distal right ureteral stone and associated right symptoms who has failed trial of medical expulsive therapy. After reviewing the management options for treatment, the patient elected to proceed with the above surgical procedure(s). We have discussed the potential benefits and risks of the procedure, side effects of the proposed treatment, the likelihood of the patient achieving the goals of the procedure, and any potential problems that might occur during the procedure or recuperation. Informed consent has been obtained.   Description of procedure:  The patient was taken to the operating room and general anesthesia was induced.  The patient was placed in the dorsal lithotomy position, prepped and draped in the usual sterile fashion, and preoperative antibiotics were administered. A preoperative time-out was performed.   Cystourethroscopy was performed.  The patients urethra was examined and was normal.  The bladder was then systematically examined in its entirety. There was no evidence for any bladder tumors, stones, or other mucosal pathology.    Attention then turned to the right ureteral orifice and a ureteral catheter was used to intubate the ureteral orifice.  Omnipaque contrast was injected  through the ureteral catheter and a retrograde pyelogram was performed with findings as dictated above.  A 0.38 sensor guidewire was then advanced through the open-ended ureteral catheter and up the right ureter into the renal pelvis under fluoroscopic guidance.  The open-ended catheter was removed.  The 6 Fr semirigid ureteroscope was then advanced into the ureter next to the sensor wire to the proximal ureter.  No stone was seen.  A second wire was then placed through the ureteroscope and advanced to the kidney with fluoroscopic guidance.  The ureteroscope was removed.  Next ureteral access sheath was placed over the second wire and advanced to the proximal ureter with fluoroscopic guidance.  The inner wire and sheath were removed.  Flexible ureteroscopy then took place.  There was no stone encountered in the upper, mid or lower poles.  There is a slight calcification adherent to the renal mucosa in the midpole.  This was less than 2 mm in size.  The ureteroscope was then removed and unison with the ureteral access sheath taking care to examine the ureter on the way out.  There was no trauma or stones seen.  The wire was then backloaded through the cystoscope and a ureteral stent was advance over the wire using Seldinger technique.  The stent was positioned appropriately under fluoroscopic and cystoscopic guidance.  The wire was then removed with an adequate stent curl noted in the renal pelvis as well as in the bladder.  The bladder was then emptied and the procedure ended.  The patient appeared to tolerate the procedure well and without complications.  The patient was able to be awakened and transferred to the recovery unit in satisfactory condition.   Disposition: The tether of the stent was left on and tucked inside the patient's vagina.  Instructions for removing the  stent have been provided to the patient.

## 2021-01-20 NOTE — Anesthesia Postprocedure Evaluation (Signed)
Anesthesia Post Note  Patient: CARESS REFFITT  Procedure(s) Performed: CYSTOSCOPY/ RETROGRADE PYELOGRAM/DIAGNOSTIC URETEROSCOPY/STENT PLACEMENT (Right: Ureter)     Patient location during evaluation: PACU Anesthesia Type: General Level of consciousness: awake and alert Pain management: pain level controlled Vital Signs Assessment: post-procedure vital signs reviewed and stable Respiratory status: spontaneous breathing, nonlabored ventilation, respiratory function stable and patient connected to nasal cannula oxygen Cardiovascular status: blood pressure returned to baseline and stable Postop Assessment: no apparent nausea or vomiting Anesthetic complications: no   No notable events documented.  Last Vitals:  Vitals:   01/20/21 1345 01/20/21 1400  BP: 136/87 (!) 141/89  Pulse:  68  Resp: 12 12  Temp:  36.7 C  SpO2: 100% 99%    Last Pain:  Vitals:   01/20/21 1400  TempSrc:   PainSc: 7                  Nyeisha Goodall

## 2021-01-20 NOTE — Interval H&P Note (Signed)
History and Physical Interval Note:  01/20/2021 12:13 PM  Elizabeth Steele  has presented today for surgery, with the diagnosis of RIGHT DISTAL STONE.  The various methods of treatment have been discussed with the patient and family. After consideration of risks, benefits and other options for treatment, the patient has consented to  Procedure(s) with comments: CYSTOSCOPY/ RETROGRADE/URETEROSCOPY/HOLMIUM LASER/STENT PLACEMENT (Right) - ONLY NEEDS 60 MIN as a surgical intervention.  The patient's history has been reviewed, patient examined, no change in status, stable for surgery.  I have reviewed the patient's chart and labs.  Questions were answered to the patient's satisfaction.     Gildo Crisco D Nilay Mangrum

## 2021-01-20 NOTE — Progress Notes (Signed)
Patient began having bladder spasms in PACU. Patient stated that it felt like it was discomfort, contractions, and that she had something between her legs. Patient rated the pain an 8 on the pain scale. Patient was given 50 mcg of fentanyl in PACU. Pain was only relieved to a 7 on pain scale. Patient was offered PO medication once she moved to phase II and she agreed that would be better. RN offered oxycodone, but Dr. Claudia Desanctis stated that the oxycodone would not help the bladder spasms. RN notified Dr. Claudia Desanctis that she did not have an order for PO ditropan; Dr. Claudia Desanctis sent prescription in for home and gave verbal order for Ditropan 10mg  to be given in phase II. Pharmacy was notified and said medication would be sent to tube station when filled. Patient was given discharge instructions while waiting on medication. Pharmacy called again about medication and stated to use what was in pyxis. Patient offered ditropan and RN explained that patient would have to wait at least 30 minutes after being given medication to ensure no reactions would take place since patient had not had the medication before. Patient stated "Oh no I can't wait. I'm on somebody else's time and she has to go back to work." RN explained to patient that she had to stay for that amount of time if given the medication and patient refused stating "You can keep that." Patient was given prescription for Ditropan to be taken at home, but would not receive her medication until the morning due to her medicines being delivered by the pharmacy. Patient discharged in stable condition with niece 90.

## 2021-01-20 NOTE — Anesthesia Procedure Notes (Signed)
Procedure Name: LMA Insertion Date/Time: 01/20/2021 12:38 PM Performed by: Myna Bright, CRNA Pre-anesthesia Checklist: Patient identified, Emergency Drugs available, Suction available and Patient being monitored Patient Re-evaluated:Patient Re-evaluated prior to induction Oxygen Delivery Method: Circle system utilized Preoxygenation: Pre-oxygenation with 100% oxygen Induction Type: IV induction Ventilation: Mask ventilation without difficulty LMA: LMA inserted LMA Size: 4.0 Tube type: Oral Number of attempts: 1 Placement Confirmation: positive ETCO2 and breath sounds checked- equal and bilateral Tube secured with: Tape Dental Injury: Teeth and Oropharynx as per pre-operative assessment

## 2021-01-20 NOTE — Anesthesia Preprocedure Evaluation (Addendum)
Anesthesia Evaluation  Patient identified by MRN, date of birth, ID band Patient awake    Reviewed: Allergy & Precautions, NPO status , Patient's Chart, lab work & pertinent test results  History of Anesthesia Complications Negative for: history of anesthetic complications  Airway Mallampati: III  TM Distance: >3 FB Neck ROM: Full    Dental  (+) Dental Advisory Given, Teeth Intact,    Pulmonary neg shortness of breath, neg sleep apnea, neg COPD, neg recent URI, Current Smoker and Patient abstained from smoking.,    breath sounds clear to auscultation       Cardiovascular hypertension, Pt. on medications (-) angina(-) Past MI and (-) CHF (-) dysrhythmias  Rhythm:Regular     Neuro/Psych negative neurological ROS  negative psych ROS   GI/Hepatic negative GI ROS, Neg liver ROS,   Endo/Other  diabetesLab Results      Component                Value               Date                      HGBA1C                   6.7 (H)             07/18/2020             Renal/GU Renal diseaseLab Results      Component                Value               Date                      CREATININE               0.71                01/05/2021           Lab Results      Component                Value               Date                      K                        4.0                 01/05/2021                Musculoskeletal  (+) Arthritis ,   Abdominal   Peds  Hematology negative hematology ROS (+) Lab Results      Component                Value               Date                      WBC                      12.3 (H)            01/05/2021                HGB  14.5                01/05/2021                HCT                      43.2                01/05/2021                MCV                      93.7                01/05/2021                PLT                      181                 01/05/2021              Anesthesia  Other Findings   Reproductive/Obstetrics Lab Results      Component                Value               Date                      PREGTESTUR               NEGATIVE            01/22/2011                                       Anesthesia Physical Anesthesia Plan  ASA: 2  Anesthesia Plan: General   Post-op Pain Management: Minimal or no pain anticipated   Induction: Intravenous  PONV Risk Score and Plan: 2 and Dexamethasone and Ondansetron  Airway Management Planned: LMA  Additional Equipment: None  Intra-op Plan:   Post-operative Plan: Extubation in OR  Informed Consent: I have reviewed the patients History and Physical, chart, labs and discussed the procedure including the risks, benefits and alternatives for the proposed anesthesia with the patient or authorized representative who has indicated his/her understanding and acceptance.     Dental advisory given  Plan Discussed with: Anesthesiologist and CRNA  Anesthesia Plan Comments:         Anesthesia Quick Evaluation

## 2021-01-20 NOTE — Discharge Instructions (Addendum)
DISCHARGE INSTRUCTIONS FOR KIDNEY STONE/URETERAL STENT   MEDICATIONS:  1. Resume all your other meds from home  2. AZO over the counter can help with the burning/stinging when you urinate. 3. Oxycodone is for moderate/severe pain, otherwise taking up to 1000 mg every 6 hours of plainTylenol will help treat your pain.   4. Take Cephalexin as instructed for next 3 days.  5. Tamsulosin can help with stent discomfort.   ACTIVITY:  1. No strenuous activity x 1week  2. No driving while on narcotic pain medications  3. Drink plenty of water  4. Continue to walk at home - you can still get blood clots when you are at home, so keep active, but don't over do it.  5. May return to work/school tomorrow or when you feel ready   BATHING:  1. You can shower and we recommend daily showers  2. You have a string coming from your urethra: The stent string is attached to your ureteral stent. Do not pull on this.   SIGNS/SYMPTOMS TO CALL:  Please call us if you have a fever greater than 101.5, uncontrolled nausea/vomiting, uncontrolled pain, dizziness, unable to urinate, bloody urine, chest pain, shortness of breath, leg swelling, leg pain, redness around wound, drainage from wound, or any other concerns or questions.   You can reach [place] at [phone].   FOLLOW-UP:  1. You have a string attached to your stent, you may remove it on Friday, 01/23/21. To do this, pull the strings until the stent is completely removed. You may feel an odd sensation in your back.     Post Anesthesia Home Care Instructions  Activity: Get plenty of rest for the remainder of the day. A responsible individual must stay with you for 24 hours following the procedure.  For the next 24 hours, DO NOT: -Drive a car -Paediatric nurse -Drink alcoholic beverages -Take any medication unless instructed by your physician -Make any legal decisions or sign important papers.  Meals: Start with liquid foods such as gelatin or soup.  Progress to regular foods as tolerated. Avoid greasy, spicy, heavy foods. If nausea and/or vomiting occur, drink only clear liquids until the nausea and/or vomiting subsides. Call your physician if vomiting continues.  Special Instructions/Symptoms: Your throat may feel dry or sore from the anesthesia or the breathing tube placed in your throat during surgery. If this causes discomfort, gargle with warm salt water. The discomfort should disappear within 24 hours.

## 2021-01-20 NOTE — Transfer of Care (Signed)
Immediate Anesthesia Transfer of Care Note  Patient: Elizabeth Steele  Procedure(s) Performed: CYSTOSCOPY/ RETROGRADE PYELOGRAM/DIAGNOSTIC URETEROSCOPY/STENT PLACEMENT (Right: Ureter)  Patient Location: PACU  Anesthesia Type:General  Level of Consciousness: awake, alert , oriented and patient cooperative  Airway & Oxygen Therapy: Patient Spontanous Breathing and Patient connected to face mask oxygen  Post-op Assessment: Report given to RN, Post -op Vital signs reviewed and stable and Patient moving all extremities  Post vital signs: Reviewed and stable  Last Vitals:  Vitals Value Taken Time  BP 130/119 01/20/21 1304  Temp    Pulse    Resp 14 01/20/21 1306  SpO2    Vitals shown include unvalidated device data.  Last Pain:  Vitals:   01/20/21 1203  TempSrc: Oral  PainSc: 7       Patients Stated Pain Goal: 8 (44/71/58 0638)  Complications: No notable events documented.

## 2021-01-21 ENCOUNTER — Encounter (HOSPITAL_BASED_OUTPATIENT_CLINIC_OR_DEPARTMENT_OTHER): Payer: Self-pay | Admitting: Urology

## 2022-02-08 ENCOUNTER — Ambulatory Visit
Admission: EM | Admit: 2022-02-08 | Discharge: 2022-02-08 | Disposition: A | Discharge status: Home / Self Care | Payer: Commercial Managed Care - HMO | Attending: Physician Assistant | Admitting: Physician Assistant

## 2022-02-08 DIAGNOSIS — T7840XA Allergy, unspecified, initial encounter: Secondary | ICD-10-CM | POA: Diagnosis not present

## 2022-02-08 DIAGNOSIS — J0110 Acute frontal sinusitis, unspecified: Secondary | ICD-10-CM

## 2022-02-08 DIAGNOSIS — R0981 Nasal congestion: Secondary | ICD-10-CM

## 2022-02-08 MED ORDER — PREDNISONE 10 MG PO TABS: 10.0000 mg | ORAL_TABLET | Freq: Three times a day (TID) | ORAL | 0 refills | Status: DC

## 2022-02-08 MED ORDER — LEVOCETIRIZINE DIHYDROCHLORIDE 5 MG PO TABS
5.0000 mg | ORAL_TABLET | Freq: Every evening | ORAL | 2 refills | Status: AC
Start: 2022-02-08 — End: ?

## 2022-02-08 MED ORDER — FLUTICASONE PROPIONATE 50 MCG/ACT NA SUSP: 2.0000 | Freq: Every day | NASAL | 0 refills | Status: AC

## 2022-02-08 NOTE — ED Triage Notes (Signed)
Pt c/o nasal drainage, facial pressure, scratchy throat, dry cough, shoulders feel heavy, breast pain  Onset ~ 1 week ago

## 2022-02-08 NOTE — ED Provider Notes (Signed)
[name]    CSN: [id number] Arrival date & time: 02/08/22  1137      History   Chief Complaint Chief Complaint  Patient presents with   facial pressure    HPI Elizabeth Steele is a 57 y.o. female.   57 year old female presents with allergies and sinus congestion.  Patient indicates for the past week she has been having increased allergy type symptoms with runny nose, postnasal drip, scratchy throat.  She indicates she is also had sinus congestion with pressure and tenderness mainly frontal and maxillary sinuses.  She indicates that the production has been mainly clear.  She also indicates having bilateral ear congestion and stuffiness.  Mild fatigue and heaviness of the shoulders.  She relates she has been taking some OTC cold medicine which is not helping her symptoms.  She does indicates that she has taken Xyzal before and this does help her allergies and she does not have any side effects from it.  Patient indicates that she would like medication to help try and control her acute allergy type symptoms and relieve the sinus pressure.  She is without fever, chills, body aches or pain, nausea, vomiting, wheezing or shortness of breath.     Past Medical History:  Diagnosis Date   Abnormal Pap smear    Allergy    Anemia    Arthritis    Blood transfusion without reported diagnosis    over 20 yrs ago per pt on 01-16-2020 due to uterine bleeding   DM Type 2    Gallstones 01/15/2021   Goiter    small goiter does not need monitoring yrs ago per pt on 01-15-2021   History of COVID-19 2021   allergy symptoms x 3 days all symptosm resolved   Hypertension    takes hctz   MRSA (methicillin resistant Staphylococcus aureus)    2005   Ovarian cyst    PID (pelvic inflammatory disease)    Right kidney stone    Substance abuse ([place])    no cocaine use in 22 and 1/2 yrs per pt on 01-15-2021   Urinary tract infection    hx of when younger per pt on 01-15-2021    Patient Active  Problem List   Diagnosis Date Noted   Fibroid uterus 12/11/2010   Abnormal uterine bleeding 12/11/2010   HTN (hypertension) 12/11/2010    Past Surgical History:  Procedure Laterality Date   ABDOMINAL HYSTERECTOMY  2013   complete   colonscopy  2018   10 year f/u   COLPOSCOPY     yrs ago per pt on 01-15-2021   CYSTOSCOPY  01/22/2011   Procedure: CYSTOSCOPY;  Surgeon: Myra C. Hulan Fray, MD;  Location: [facility];  Service: Gynecology;  Laterality: N/A;   CYSTOSCOPY/URETEROSCOPY/HOLMIUM LASER/STENT PLACEMENT Right 01/20/2021   Procedure: CYSTOSCOPY/ RETROGRADE PYELOGRAM/DIAGNOSTIC URETEROSCOPY/STENT PLACEMENT;  Surgeon: Robley Fries, MD;  Location: [facility];  Service: Urology;  Laterality: Right;   DILATION AND CURETTAGE OF UTERUS     yrs ago per pt on 01-15-2021   INDUCED ABORTION     x 3   KNEE ARTHROSCOPY     2 on right x 1 on left yrs ago per pt on 01-15-2021   SALIVARY STONE REMOVAL     gland removed right yrs ago, stone removed prior to that surgery    OB History     Gravida  5   Para  2   Term  1   Preterm  1  AB  3   Living  1      SAB      IAB  3   Ectopic      Multiple      Live Births  1            Home Medications    Prior to Admission medications   Medication Sig Start Date End Date Taking? Authorizing Provider  fluticasone (FLONASE) 50 MCG/ACT nasal spray Place 2 sprays into both nostrils daily. 02/08/22  Yes Nyoka Lint, PA-C  levocetirizine (XYZAL) 5 MG tablet Take 1 tablet (5 mg total) by mouth every evening. For allergies. 02/08/22  Yes Nyoka Lint, PA-C  predniSONE (DELTASONE) 10 MG tablet Take 1 tablet (10 mg total) by mouth in the morning, at noon, and at bedtime. 02/08/22  Yes Nyoka Lint, PA-C  amLODipine (NORVASC) 10 MG tablet Take 1 tablet (10 mg total) by mouth daily. 07/18/20   Teodora Medici, FNP  BAYER MICROLET LANCETS lancets Use as instructed 10/14/12   Luvenia Redden, PA-C  cyclobenzaprine (FLEXERIL) 5 MG tablet  Take 5 mg by mouth 3 (three) times daily as needed for muscle spasms.    [provider]  EPINEPHrine 0.3 mg/0.3 mL IJ SOAJ injection Inject 0.3 mg into the muscle as needed for anaphylaxis. Patient not taking: Reported on 01/20/2021    [provider]  glucose blood (BAYER CONTOUR NEXT TEST) test strip Use as instructed 10/14/12   Luvenia Redden, PA-C  hydrochlorothiazide (HYDRODIURIL) 12.5 MG tablet Take 2 tablets (25 mg total) by mouth daily. Patient taking differently: Take 12.5 mg by mouth daily. 07/18/20   Teodora Medici, FNP  Melatonin 10 MG TABS Take by mouth as needed.    [provider]  metFORMIN (GLUCOPHAGE) 500 MG tablet Take 2 tablets (1,000 mg total) by mouth 2 (two) times daily with a meal. 07/18/20   Mound, Michele Rockers, FNP  ondansetron (ZOFRAN) 4 MG tablet Take 1 tablet (4 mg total) by mouth every 8 (eight) hours as needed for nausea or vomiting. 01/06/21   Muthersbaugh, Jarrett Soho, PA-C  oxyCODONE (ROXICODONE) 5 MG immediate release tablet Take 1 tablet (5 mg total) by mouth every 6 (six) hours as needed for severe pain. 01/06/21   Muthersbaugh, Jarrett Soho, PA-C  pregabalin (LYRICA) 75 MG capsule Take 75 mg by mouth as needed.    [provider]  tamsulosin (FLOMAX) 0.4 MG CAPS capsule Take 1 capsule (0.4 mg total) by mouth 2 (two) times daily. 01/06/21   Muthersbaugh, Jarrett Soho, PA-C  traZODone (DESYREL) 50 MG tablet Take 2 tablets (100 mg total) by mouth at bedtime. Patient taking differently: Take 50 mg by mouth at bedtime as needed. 07/18/20   Teodora Medici, FNP    Family History Family History  Problem Relation Age of Onset   Cancer Father        throat   Esophageal cancer Father    Cancer Brother        leukemia   Cancer Daughter        breast   Diabetes Mother    Diabetes Paternal Aunt    Thyroid disease Maternal Grandmother    Anesthesia problems Neg Hx    Colon cancer Neg Hx    Liver cancer Neg Hx    Pancreatic cancer Neg Hx    Rectal cancer  Neg Hx    Stomach cancer Neg Hx     Social History Social History   Tobacco Use  Smoking status: Every Day    Packs/day: 0.50    Years: 30.00    Total pack years: 15.00    Types: Cigarettes   Smokeless tobacco: Never  Vaping Use   Vaping Use: Never used  Substance Use Topics   Alcohol use: No   Drug use: Not Currently    Types: Cocaine    Comment: Pt has been clean from cocaine  22 and 1/2  years     Allergies   Shellfish allergy and Bactrim [sulfamethoxazole-trimethoprim]   Review of Systems Review of Systems  HENT:  Positive for postnasal drip, rhinorrhea and sinus pressure (maxillary and frontal).      Physical Exam Triage Vital Signs ED Triage Vitals [02/08/22 1230]  Enc Vitals Group     BP (!) 142/90     Pulse Rate 80     Resp 16     Temp 98.4 F (36.9 C)     Temp Source Oral     SpO2 97 %     Weight      Height      Head Circumference      Peak Flow      Pain Score 8     Pain Loc      Pain Edu?      Excl. in [place]?    No data found.  Updated Vital Signs BP (!) 142/90 (BP Location: Left Arm)   Pulse 80   Temp 98.4 F (36.9 C) (Oral)   Resp 16   LMP 11/18/2010   SpO2 97%   Visual Acuity Right Eye Distance:   Left Eye Distance:   Bilateral Distance:    Right Eye Near:   Left Eye Near:    Bilateral Near:     Physical Exam Constitutional:      Appearance: Normal appearance.  HENT:     Right Ear: Ear canal normal. Tympanic membrane is injected.     Left Ear: Ear canal normal. Tympanic membrane is injected.     Mouth/Throat:     Mouth: Mucous membranes are moist.     Pharynx: Oropharynx is clear.     Comments: Facial: There is tenderness on palpation of the frontal and maxillary sinuses bilaterally.  No unusual swelling or redness present. Cardiovascular:     Rate and Rhythm: Normal rate and regular rhythm.     Heart sounds: Normal heart sounds.  Pulmonary:     Effort: Pulmonary effort is normal.     Breath sounds: Normal breath  sounds and air entry. No wheezing, rhonchi or rales.  Lymphadenopathy:     Cervical: No cervical adenopathy.  Neurological:     Mental Status: She is alert.      UC Treatments / Results  Labs (all labs ordered are listed, but only abnormal results are displayed) Labs Reviewed - No data to display  EKG   Radiology No results found.  Procedures Procedures (including critical care time)  Medications Ordered in UC Medications - No data to display  Initial Impression / Assessment and Plan / UC Course  I have reviewed the triage vital signs and the nursing notes.  Pertinent labs & imaging results that were available during my care of the patient were reviewed by me and considered in my medical decision making (see chart for details).    Plan: The diagnosis will be treated with the following: 1.  Frontal sinusitis: A.  Flonase nasal spray, 2 sprays each nostril once daily to help decrease sinus congestion.  2.  Seasonal allergies: A.  Xyzal 5 mg once daily to control allergies. 3.  Nasal sinus congestion: A.  Flonase nasal spray, 2 sprays each nostril once daily to help decrease sinus congestion. B.  Prednisone 10 mg every 8 hours for 5 days only to help decrease acute sinus pressure and congestion. 4.  Patient advised follow-up PCP or return to urgent care if symptoms fail to improve. Final Clinical Impressions(s) / UC Diagnoses   Final diagnoses:  Acute non-recurrent frontal sinusitis  Allergy, initial encounter  Congestion of nasal sinus     Discharge Instructions      Advised to take the Xyzal daily to help control allergies. Advised to use the Flonase nasal spray, 2 sprays each nostril once a day to help decrease sinus congestion, drainage, and irritability. Advised take the prednisone 10 mg 3 times a day over the next several days until completed as this will help reduce acute allergy congestion and drainage.  Advised to follow-up PCP or return to urgent care  if symptoms fail to improve.    ED Prescriptions     Medication Sig Dispense Auth. Provider   fluticasone (FLONASE) 50 MCG/ACT nasal spray Place 2 sprays into both nostrils daily. 9.9 mL Nyoka Lint, PA-C   predniSONE (DELTASONE) 10 MG tablet Take 1 tablet (10 mg total) by mouth in the morning, at noon, and at bedtime. 15 tablet Nyoka Lint, PA-C   levocetirizine (XYZAL) 5 MG tablet Take 1 tablet (5 mg total) by mouth every evening. For allergies. 30 tablet Nyoka Lint, PA-C      PDMP not reviewed this encounter.   Nyoka Lint, PA-C 02/08/22 1248

## 2022-02-08 NOTE — Discharge Instructions (Signed)
Advised to take the Xyzal daily to help control allergies. Advised to use the Flonase nasal spray, 2 sprays each nostril once a day to help decrease sinus congestion, drainage, and irritability. Advised take the prednisone 10 mg 3 times a day over the next several days until completed as this will help reduce acute allergy congestion and drainage.  Advised to follow-up PCP or return to urgent care if symptoms fail to improve.

## 2022-04-14 ENCOUNTER — Emergency Department (HOSPITAL_COMMUNITY): Payer: BC Managed Care – PPO

## 2022-04-14 ENCOUNTER — Emergency Department (HOSPITAL_COMMUNITY)
Admission: EM | Admit: 2022-04-14 | Discharge: 2022-04-14 | Disposition: A | Discharge status: Home / Self Care | Payer: BC Managed Care – PPO | Attending: Emergency Medicine | Admitting: Emergency Medicine

## 2022-04-14 ENCOUNTER — Encounter (HOSPITAL_COMMUNITY): Payer: Self-pay

## 2022-04-14 DIAGNOSIS — S0512XA Contusion of eyeball and orbital tissues, left eye, initial encounter: Secondary | ICD-10-CM | POA: Insufficient documentation

## 2022-04-14 DIAGNOSIS — Z79899 Other long term (current) drug therapy: Secondary | ICD-10-CM | POA: Diagnosis not present

## 2022-04-14 DIAGNOSIS — I10 Essential (primary) hypertension: Secondary | ICD-10-CM | POA: Insufficient documentation

## 2022-04-14 DIAGNOSIS — E119 Type 2 diabetes mellitus without complications: Secondary | ICD-10-CM | POA: Diagnosis not present

## 2022-04-14 DIAGNOSIS — Y92009 Unspecified place in unspecified non-institutional (private) residence as the place of occurrence of the external cause: Secondary | ICD-10-CM | POA: Insufficient documentation

## 2022-04-14 DIAGNOSIS — S060X1A Concussion with loss of consciousness of 30 minutes or less, initial encounter: Secondary | ICD-10-CM | POA: Insufficient documentation

## 2022-04-14 DIAGNOSIS — Z7984 Long term (current) use of oral hypoglycemic drugs: Secondary | ICD-10-CM | POA: Diagnosis not present

## 2022-04-14 DIAGNOSIS — W01198A Fall on same level from slipping, tripping and stumbling with subsequent striking against other object, initial encounter: Secondary | ICD-10-CM | POA: Diagnosis not present

## 2022-04-14 DIAGNOSIS — S0990XA Unspecified injury of head, initial encounter: Secondary | ICD-10-CM | POA: Diagnosis present

## 2022-04-14 LAB — CBC
HCT: 41.3 % (ref 36.0–46.0)
Hemoglobin: 13.8 g/dL (ref 12.0–15.0)
MCH: 31.2 pg (ref 26.0–34.0)
MCHC: 33.4 g/dL (ref 30.0–36.0)
MCV: 93.2 fL (ref 80.0–100.0)
Platelets: 182 10*3/uL (ref 150–400)
RBC: 4.43 MIL/uL (ref 3.87–5.11)
RDW: 13.3 % (ref 11.5–15.5)
WBC: 6.2 10*3/uL (ref 4.0–10.5)
nRBC: 0 % (ref 0.0–0.2)

## 2022-04-14 LAB — BASIC METABOLIC PANEL
Anion gap: 14 (ref 5–15)
BUN: 6 mg/dL (ref 6–20)
CO2: 26 mmol/L (ref 22–32)
Calcium: 9.3 mg/dL (ref 8.9–10.3)
Chloride: 99 mmol/L (ref 98–111)
Creatinine, Ser: 0.69 mg/dL (ref 0.44–1.00)
GFR, Estimated: >60 mL/min (ref >60)
Glucose, Bld: 184 mg/dL — ABNORMAL HIGH (ref 70–99)
Potassium: 3.6 mmol/L (ref 3.5–5.1)
Sodium: 139 mmol/L (ref 135–145)

## 2022-04-14 MED ORDER — ACETAMINOPHEN 325 MG PO TABS: 650.0000 mg | ORAL_TABLET | Freq: Four times a day (QID) | ORAL | Status: DC | PRN

## 2022-04-14 MED ORDER — MECLIZINE HCL 25 MG PO TABS: 25.0000 mg | ORAL_TABLET | Freq: Once | ORAL | Status: DC

## 2022-04-14 MED ORDER — MECLIZINE HCL 12.5 MG PO TABS: 12.5000 mg | ORAL_TABLET | Freq: Three times a day (TID) | ORAL | 0 refills | Status: AC | PRN

## 2022-04-14 MED ORDER — LACTATED RINGERS IV BOLUS
1000.0000 mL | Freq: Once | INTRAVENOUS | Status: AC
  Administered 2022-04-14: 1000 mL via INTRAVENOUS

## 2022-04-14 MED ORDER — ONDANSETRON HCL 4 MG/2ML IJ SOLN: 4.0000 mg | Freq: Once | INTRAMUSCULAR | Status: DC

## 2022-04-14 NOTE — ED Notes (Signed)
Pt ambulated independently to restroom, steady on her feet with no assistance needed. Low fall risk assessed at this time

## 2022-04-14 NOTE — ED Provider Notes (Cosign Needed Addendum)
[facility] Provider Note   CSN: [id number] Arrival date & time: 04/14/22  1352     History  No chief complaint on file.   Elizabeth Steele is a 57 y.o. female with PMH HTN, T2DM presenting with dizziness after fall last Friday.  She said that she was carrying her groceries to her house when she slipped on the top step and then hit her head on the doorknob.  She did endorse loss of consciousness for approximately 60 seconds before one of her family members was able to help her up.  She is not on any blood thinners.  She has not had any other recent falls.  She denied any dizziness, palpitations, chest pain or dyspnea, preceding her fall.  She said that immediately following her fall, she had some pain over her left temporal side of her orbit and her left temple but that she wanted to manage it at home did not come in.  However, over the next couple days she kept having intermittent dizziness and paresthesias and worsening bruising and swelling over her left orbit.       Home Medications Prior to Admission medications   Medication Sig Start Date End Date Taking? Authorizing Provider  meclizine (ANTIVERT) 12.5 MG tablet Take 1 tablet (12.5 mg total) by mouth 3 (three) times daily as needed for dizziness. 04/14/22  Yes Linus Galas, MD  amLODipine (NORVASC) 10 MG tablet Take 1 tablet (10 mg total) by mouth daily. 07/18/20   Teodora Medici, FNP  BAYER MICROLET LANCETS lancets Use as instructed 10/14/12   Luvenia Redden, PA-C  cyclobenzaprine (FLEXERIL) 5 MG tablet Take 5 mg by mouth 3 (three) times daily as needed for muscle spasms.    [provider]  EPINEPHrine 0.3 mg/0.3 mL IJ SOAJ injection Inject 0.3 mg into the muscle as needed for anaphylaxis. Patient not taking: Reported on 01/20/2021    [provider]  fluticasone (FLONASE) 50 MCG/ACT nasal spray Place 2 sprays into both nostrils daily. 02/08/22   Nyoka Lint, PA-C   glucose blood [facility] NEXT TEST) test strip Use as instructed 10/14/12   Luvenia Redden, PA-C  hydrochlorothiazide (HYDRODIURIL) 12.5 MG tablet Take 2 tablets (25 mg total) by mouth daily. Patient taking differently: Take 12.5 mg by mouth daily. 07/18/20   Teodora Medici, FNP  levocetirizine (XYZAL) 5 MG tablet Take 1 tablet (5 mg total) by mouth every evening. For allergies. 02/08/22   Nyoka Lint, PA-C  Melatonin 10 MG TABS Take by mouth as needed.    [provider]  metFORMIN (GLUCOPHAGE) 500 MG tablet Take 2 tablets (1,000 mg total) by mouth 2 (two) times daily with a meal. 07/18/20   Mound, Michele Rockers, FNP  ondansetron (ZOFRAN) 4 MG tablet Take 1 tablet (4 mg total) by mouth every 8 (eight) hours as needed for nausea or vomiting. 01/06/21   Muthersbaugh, Jarrett Soho, PA-C  oxyCODONE (ROXICODONE) 5 MG immediate release tablet Take 1 tablet (5 mg total) by mouth every 6 (six) hours as needed for severe pain. 01/06/21   Muthersbaugh, Jarrett Soho, PA-C  predniSONE (DELTASONE) 10 MG tablet Take 1 tablet (10 mg total) by mouth in the morning, at noon, and at bedtime. 02/08/22   Nyoka Lint, PA-C  pregabalin (LYRICA) 75 MG capsule Take 75 mg by mouth as needed.    [provider]  tamsulosin (FLOMAX) 0.4 MG CAPS capsule Take 1 capsule (0.4 mg total) by mouth 2 (  two) times daily. 01/06/21   Muthersbaugh, Jarrett Soho, PA-C  traZODone (DESYREL) 50 MG tablet Take 2 tablets (100 mg total) by mouth at bedtime. Patient taking differently: Take 50 mg by mouth at bedtime as needed. 07/18/20   Teodora Medici, FNP      Allergies    Shellfish allergy and Bactrim [sulfamethoxazole-trimethoprim]    Review of Systems   See HPI Physical Exam Updated Vital Signs BP 139/88   Pulse 81   Temp 98.2 F (36.8 C) (Oral)   Resp 16   Ht 5\' 5"  (1.651 m)   Wt 81.6 kg   LMP 11/18/2010   SpO2 100%   BMI 29.95 kg/m  Physical Exam Constitutional:      General: She is not in acute distress.    Appearance:  Normal appearance.  HENT:     Head: Normocephalic. Contusion (Left periorbital and temporal edema and ecchymosis, no laceration) present.  Eyes:     Extraocular Movements: Extraocular movements intact.     Pupils: Pupils are equal, round, and reactive to light.  Cardiovascular:     Rate and Rhythm: Normal rate and regular rhythm.  Pulmonary:     Effort: Pulmonary effort is normal. No respiratory distress.     Breath sounds: Normal breath sounds.  Musculoskeletal:        General: No tenderness or signs of injury. Normal range of motion.     Cervical back: No rigidity or tenderness.  Neurological:     General: No focal deficit present.     Mental Status: She is alert and oriented to person, place, and time.     Cranial Nerves: No cranial nerve deficit.     Motor: No weakness.     ED Results / Procedures / Treatments   Labs (all labs ordered are listed, but only abnormal results are displayed) Labs Reviewed  BASIC METABOLIC PANEL - Abnormal; Notable for the following components:      Result Value   Glucose, Bld 184 (*)    All other components within normal limits  CBC  URINALYSIS, ROUTINE W REFLEX MICROSCOPIC  CBG MONITORING, ED    EKG EKG Interpretation  Date/Time:  Wednesday April 14 2022 14:31:19 EDT Ventricular Rate:  95 PR Interval:  128 QRS Duration: 82 QT Interval:  326 QTC Calculation: 409 R Axis:   77 Text Interpretation: Normal sinus rhythm ST & T wave abnormality, consider inferolateral ischemia Abnormal ECG When compared with ECG of 20-Jan-2021 11:48, PREVIOUS ECG IS PRESENT when compared to prior,  overall similar appearance with similar t waves to 2019. No STEMI Confirmed by Antony Blackbird [id number]) on 04/14/2022 4:06:49 PM  Radiology CT Head Wo Contrast  Result Date: 04/14/2022 CLINICAL DATA:  Provided history: Headache, new onset. Additional history provided: Dizziness. Fall. Bruising to face. EXAM: CT HEAD WITHOUT CONTRAST TECHNIQUE: Contiguous axial images  were obtained from the base of the skull through the vertex without intravenous contrast. RADIATION DOSE REDUCTION: This exam was performed according to the departmental dose-optimization program which includes automated exposure control, adjustment of the mA and/or kV according to patient size and/or use of iterative reconstruction technique. COMPARISON:  Head CT 06/01/2012. FINDINGS: Brain: No age advanced or lobar predominant parenchymal atrophy. There is no acute infarct. No evidence of an intracranial mass. No chronic intracranial blood products. No extra-axial fluid collection. No midline shift. Vascular: No hyperdense vessel. Atherosclerotic calcifications. Skull: No fracture or aggressive osseous lesion. Sinuses/Orbits: No mass or acute finding within the imaged orbits. No significant  paranasal sinus disease at the imaged levels IMPRESSION: No evidence of an acute intracranial abnormality. Electronically Signed   By: Kellie Simmering D.O.   On: 04/14/2022 15:28    Procedures   Medications Ordered in ED Medications  acetaminophen (TYLENOL) tablet 650 mg (has no administration in time range)  ondansetron (ZOFRAN) injection 4 mg (4 mg Intravenous Not Given 04/14/22 1823)  meclizine (ANTIVERT) tablet 25 mg (25 mg Oral Not Given 04/14/22 1823)  lactated ringers bolus 1,000 mL (1,000 mLs Intravenous New Bag/Given 04/14/22 1713)    ED Course/ Medical Decision Making/ A&P   Medical Decision Making Amount and/or Complexity of Data Reviewed Labs: ordered.  Risk OTC drugs. Prescription drug management.   JUSTIN BROWNFIELD is a 57 y.o. female with PMH HTN, T2DM presenting with dizziness after fall last Friday.  She has subsequently been having intermittent dizziness and nausea as well as paresthesias but no further falls or loss of consciousness.  No focal neurological weakness or numbness and no significant changes in vision.  Still having soreness around her left lateral orbit and temporal region where she  has areas of ecchymoses on exam.  Her neurological exam is unremarkable.  She is endorsing some orthostatic dizziness and possible vertigo on my initial exam as well.  Her CT head initial lab work are unremarkable without any intracranial bleed or fracture.  Suspect that she has postconcussive syndrome and will manage symptomatically as appropriate.  For orthostatic dizziness, will fluid resuscitate as well.  Update: Patient was feeling better after fluids.  She is okay with discharge and symptomatic treatment for postconcussive syndrome.  Return precautions provided.   Final Clinical Impression(s) / ED Diagnoses Final diagnoses:  Concussion with loss of consciousness of 30 minutes or less, initial encounter    Rx / DC Orders ED Discharge Orders          Ordered    meclizine (ANTIVERT) 12.5 MG tablet  3 times daily PRN        04/14/22 1845              Linus Galas, MD 04/14/22 1850    Linus Galas, MD 04/14/22 1859    Tegeler, Gwenyth Allegra, MD 04/15/22 [phone]

## 2022-04-14 NOTE — ED Notes (Signed)
Pt offered Tylenol, ice pack and something to eat. Declined Tylenol and ice pack, but given crackers and applesauce. Pt resting at this time

## 2022-04-14 NOTE — ED Triage Notes (Addendum)
Py bib ems from home; Friday pt had mechanical fall on step leading to apt, bumped head; positive loc; hematoma to L forehead; today worsening bruising to L eye, swelling around orbit; while at work today, pt began experiencing  intermittent dizziness; no vomiting, VSS; 132/86, HR 108, cbg 219; not on thinners; ambulatory today; pt endorsing HA; Pupils PERRLA; equal bilateral strength, alert and oriented x 4 on arrival to ED

## 2022-04-14 NOTE — ED Provider Triage Note (Signed)
Emergency Medicine Provider Triage Evaluation Note  Elizabeth Steele , a 57 y.o. female  was evaluated in triage.  Pt complains of headache, dizziness and for bruising to the face.  Patient states she had tripped over her doorstep coming in with groceries and hit her head on the doorknob 2 days ago.  Today she noted some bruising around her left eye which is new.  She has had a persistent, mild headache and dizziness without ataxia or vomiting or vision change..  Review of Systems  Positive: Head injury Negative: fever  Physical Exam  BP 138/81   Pulse 100   Temp 98.2 F (36.8 C) (Oral)   Resp 15   Ht 5\' 5"  (1.651 m)   Wt 81.6 kg   LMP 11/18/2010   SpO2 100%   BMI 29.95 kg/m  Gen:   Awake, no distress   Resp:  Normal effort  MSK:   Moves extremities without difficulty  Other:    Medical Decision Making  Medically screening exam initiated at 2:10 PM.  Appropriate orders placed.  Bing Neighbors was informed that the remainder of the evaluation will be completed by another provider, this initial triage assessment does not replace that evaluation, and the importance of remaining in the ED until their evaluation is complete.     Margarita Mail, PA-C 04/14/22 1413

## 2022-04-14 NOTE — Discharge Instructions (Signed)
Please take meclizine up to 3 times a day as needed for dizziness.  Take your Zofran every 8 hours as needed for nausea. Take 400-600 mg of ibuprofen every 6 hours as needed for pain.  You can also use ice to the affected area.  If your symptoms do not improve in the next couple weeks, contact your primary doctor and follow-up with them.

## 2022-09-22 ENCOUNTER — Other Ambulatory Visit: Payer: Self-pay | Admitting: Nurse Practitioner

## 2022-09-22 DIAGNOSIS — Z1231 Encounter for screening mammogram for malignant neoplasm of breast: Secondary | ICD-10-CM

## 2023-03-15 ENCOUNTER — Emergency Department (HOSPITAL_COMMUNITY)

## 2023-03-15 ENCOUNTER — Encounter (HOSPITAL_COMMUNITY): Payer: Self-pay

## 2023-03-15 ENCOUNTER — Emergency Department (HOSPITAL_COMMUNITY)
Admission: EM | Admit: 2023-03-15 | Discharge: 2023-03-15 | Disposition: A | Discharge status: Home / Self Care | Attending: Emergency Medicine | Admitting: Emergency Medicine

## 2023-03-15 ENCOUNTER — Other Ambulatory Visit: Payer: Self-pay

## 2023-03-15 DIAGNOSIS — Z79899 Other long term (current) drug therapy: Secondary | ICD-10-CM | POA: Diagnosis not present

## 2023-03-15 DIAGNOSIS — I10 Essential (primary) hypertension: Secondary | ICD-10-CM | POA: Insufficient documentation

## 2023-03-15 DIAGNOSIS — Z7984 Long term (current) use of oral hypoglycemic drugs: Secondary | ICD-10-CM | POA: Insufficient documentation

## 2023-03-15 DIAGNOSIS — R0789 Other chest pain: Secondary | ICD-10-CM | POA: Insufficient documentation

## 2023-03-15 DIAGNOSIS — R000 Tachycardia, unspecified: Secondary | ICD-10-CM | POA: Diagnosis not present

## 2023-03-15 DIAGNOSIS — R079 Chest pain, unspecified: Secondary | ICD-10-CM | POA: Diagnosis present

## 2023-03-15 DIAGNOSIS — R0602 Shortness of breath: Secondary | ICD-10-CM | POA: Insufficient documentation

## 2023-03-15 DIAGNOSIS — R109 Unspecified abdominal pain: Secondary | ICD-10-CM | POA: Insufficient documentation

## 2023-03-15 DIAGNOSIS — E119 Type 2 diabetes mellitus without complications: Secondary | ICD-10-CM | POA: Insufficient documentation

## 2023-03-15 LAB — URINALYSIS, W/ REFLEX TO CULTURE (INFECTION SUSPECTED)
Bacteria, UA: NONE SEEN
Bilirubin Urine: NEGATIVE
Glucose, UA: NEGATIVE mg/dL
Hgb urine dipstick: NEGATIVE
Ketones, ur: NEGATIVE mg/dL
Leukocytes,Ua: NEGATIVE
Nitrite: NEGATIVE
Protein, ur: NEGATIVE mg/dL
Specific Gravity, Urine: >1.046 — ABNORMAL HIGH (ref 1.005–1.030)
pH: 6 (ref 5.0–8.0)

## 2023-03-15 LAB — CBC
HCT: 42.6 % (ref 36.0–46.0)
Hemoglobin: 14.1 g/dL (ref 12.0–15.0)
MCH: 30.5 pg (ref 26.0–34.0)
MCHC: 33.1 g/dL (ref 30.0–36.0)
MCV: 92 fL (ref 80.0–100.0)
Platelets: 181 10*3/uL (ref 150–400)
RBC: 4.63 MIL/uL (ref 3.87–5.11)
RDW: 13.2 % (ref 11.5–15.5)
WBC: 6.1 10*3/uL (ref 4.0–10.5)
nRBC: 0 % (ref 0.0–0.2)

## 2023-03-15 LAB — BASIC METABOLIC PANEL
Anion gap: 11 (ref 5–15)
BUN: 9 mg/dL (ref 6–20)
CO2: 26 mmol/L (ref 22–32)
Calcium: 9.2 mg/dL (ref 8.9–10.3)
Chloride: 103 mmol/L (ref 98–111)
Creatinine, Ser: 0.69 mg/dL (ref 0.44–1.00)
GFR, Estimated: >60 mL/min (ref >60)
Glucose, Bld: 217 mg/dL — ABNORMAL HIGH (ref 70–99)
Potassium: 3.8 mmol/L (ref 3.5–5.1)
Sodium: 140 mmol/L (ref 135–145)

## 2023-03-15 LAB — HEPATIC FUNCTION PANEL
ALT: 22 U/L (ref 0–44)
AST: 20 U/L (ref 15–41)
Albumin: 3.7 g/dL (ref 3.5–5.0)
Alkaline Phosphatase: 75 U/L (ref 38–126)
Bilirubin, Direct: 0.3 mg/dL — ABNORMAL HIGH (ref 0.0–0.2)
Indirect Bilirubin: 0.6 mg/dL (ref 0.3–0.9)
Total Bilirubin: 0.9 mg/dL (ref 0.0–1.2)
Total Protein: 6.4 g/dL — ABNORMAL LOW (ref 6.5–8.1)

## 2023-03-15 LAB — LIPASE, BLOOD: Lipase: 35 U/L (ref 11–51)

## 2023-03-15 LAB — TROPONIN I (HIGH SENSITIVITY)
Troponin I (High Sensitivity): 2 ng/L (ref <18)
Troponin I (High Sensitivity): 3 ng/L (ref <18)

## 2023-03-15 MED ORDER — FENTANYL CITRATE PF 50 MCG/ML IJ SOSY
50.0000 ug | PREFILLED_SYRINGE | Freq: Once | INTRAMUSCULAR | Status: AC
  Administered 2023-03-15: 50 ug via INTRAVENOUS
  Filled 2023-03-15: qty 1

## 2023-03-15 MED ORDER — KETOROLAC TROMETHAMINE 15 MG/ML IJ SOLN
15.0000 mg | Freq: Once | INTRAMUSCULAR | Status: AC
  Administered 2023-03-15: 15 mg via INTRAVENOUS
  Filled 2023-03-15: qty 1

## 2023-03-15 MED ORDER — KETOROLAC TROMETHAMINE 10 MG PO TABS: 10.0000 mg | ORAL_TABLET | Freq: Four times a day (QID) | ORAL | 0 refills | Status: AC | PRN

## 2023-03-15 MED ORDER — METHOCARBAMOL 750 MG PO TABS: 750.0000 mg | ORAL_TABLET | Freq: Four times a day (QID) | ORAL | 0 refills | Status: AC

## 2023-03-15 MED ORDER — IOHEXOL 350 MG/ML SOLN
75.0000 mL | Freq: Once | INTRAVENOUS | Status: AC | PRN
  Administered 2023-03-15: 75 mL via INTRAVENOUS

## 2023-03-15 NOTE — Discharge Instructions (Signed)
 Your workup today was reassuring.  Please take the medications as prescribed and schedule a follow-up appointment with the orthopedist.  Return to the ER for worsening symptoms.

## 2023-03-15 NOTE — ED Provider Notes (Signed)
 I was asked to follow-up on the patient's CT imaging and reassess for ultimate disposition.  She presents today with flank and chest wall pain that is reproducible on exam. Physical Exam  BP 135/62   Pulse 87   Temp 98.2 F (36.8 C)   Resp 17   Ht 5\' 5"  (1.651 m)   Wt 90.7 kg   LMP 11/18/2010   SpO2 100%   BMI 33.28 kg/m   Physical Exam General: No acute distress Procedures  Procedures  ED Course / MDM    Medical Decision Making Amount and/or Complexity of Data Reviewed Labs: ordered. Radiology: ordered.  Risk Prescription drug management.   The patient CT scans are unremarkable.  This is very reproducible on exam.  Suspect this is likely due to musculoskeletal pain.  Will give the patient a short course of anti-inflammatories and muscle relaxers and Ortho follow-up.       Durwin Glaze, MD 03/15/23 Paulo Fruit

## 2023-03-15 NOTE — ED Triage Notes (Signed)
 Pt c/o left sided chest pain that radiates into left flank that started 1-2 weeks ago. Pt denies nausea, vomiting, or shortness of breath.

## 2023-03-15 NOTE — ED Notes (Signed)
 Pt to CT

## 2023-03-15 NOTE — ED Provider Notes (Signed)
 [place] EMERGENCY DEPARTMENT AT [facility] Provider Note   CSN: [id number] Arrival date & time: 03/15/23  1001     History  Chief Complaint  Patient presents with   Chest Pain    Elizabeth Steele is a 58 y.o. female.  The history is provided by the patient and medical records. No language interpreter was used.  Chest Pain Pain location:  L chest and L lateral chest Pain quality: aching and sharp   Pain radiates to:  Does not radiate Pain severity:  Severe Onset quality:  Gradual Duration:  2 weeks Timing:  Constant Progression:  Waxing and waning Chronicity:  New Context: not trauma   Relieved by:  Nothing Worsened by:  Certain positions and movement Ineffective treatments:  None tried Associated symptoms: abdominal pain, back pain and shortness of breath   Associated symptoms: no altered mental status, no cough, no diaphoresis, no fatigue, no headache, no lower extremity edema, no nausea, no palpitations and no vomiting        Home Medications Prior to Admission medications   Medication Sig Start Date End Date Taking? Authorizing Provider  amLODipine (NORVASC) 10 MG tablet Take 1 tablet (10 mg total) by mouth daily. 07/18/20   Gustavus Bryant, FNP  BAYER MICROLET LANCETS lancets Use as instructed 10/14/12   Marny Lowenstein, PA-C  cyclobenzaprine (FLEXERIL) 5 MG tablet Take 5 mg by mouth 3 (three) times daily as needed for muscle spasms.    [provider]  EPINEPHrine 0.3 mg/0.3 mL IJ SOAJ injection Inject 0.3 mg into the muscle as needed for anaphylaxis. Patient not taking: Reported on 01/20/2021    [provider]  fluticasone (FLONASE) 50 MCG/ACT nasal spray Place 2 sprays into both nostrils daily. 02/08/22   Ellsworth Lennox, PA-C  glucose blood [facility] NEXT TEST) test strip Use as instructed 10/14/12   Marny Lowenstein, PA-C  hydrochlorothiazide (HYDRODIURIL) 12.5 MG tablet Take 2 tablets (25 mg total) by mouth daily. Patient taking  differently: Take 12.5 mg by mouth daily. 07/18/20   Gustavus Bryant, FNP  levocetirizine (XYZAL) 5 MG tablet Take 1 tablet (5 mg total) by mouth every evening. For allergies. 02/08/22   Ellsworth Lennox, PA-C  meclizine (ANTIVERT) 12.5 MG tablet Take 1 tablet (12.5 mg total) by mouth 3 (three) times daily as needed for dizziness. 04/14/22   Lyndle Herrlich, MD  Melatonin 10 MG TABS Take by mouth as needed.    [provider]  metFORMIN (GLUCOPHAGE) 500 MG tablet Take 2 tablets (1,000 mg total) by mouth 2 (two) times daily with a meal. 07/18/20   Mound, Acie Fredrickson, FNP  ondansetron (ZOFRAN) 4 MG tablet Take 1 tablet (4 mg total) by mouth every 8 (eight) hours as needed for nausea or vomiting. 01/06/21   Muthersbaugh, Dahlia Client, PA-C  oxyCODONE (ROXICODONE) 5 MG immediate release tablet Take 1 tablet (5 mg total) by mouth every 6 (six) hours as needed for severe pain. 01/06/21   Muthersbaugh, Dahlia Client, PA-C  predniSONE (DELTASONE) 10 MG tablet Take 1 tablet (10 mg total) by mouth in the morning, at noon, and at bedtime. 02/08/22   Ellsworth Lennox, PA-C  pregabalin (LYRICA) 75 MG capsule Take 75 mg by mouth as needed.    [provider]  tamsulosin (FLOMAX) 0.4 MG CAPS capsule Take 1 capsule (0.4 mg total) by mouth 2 (two) times daily. 01/06/21   Muthersbaugh, Dahlia Client, PA-C  traZODone (DESYREL) 50 MG tablet Take 2 tablets (100 mg total)  by mouth at bedtime. Patient taking differently: Take 50 mg by mouth at bedtime as needed. 07/18/20   Gustavus Bryant, FNP      Allergies    Shellfish allergy and Bactrim [sulfamethoxazole-trimethoprim]    Review of Systems   Review of Systems  Constitutional:  Negative for diaphoresis and fatigue.  HENT:  Negative for congestion.   Respiratory:  Positive for shortness of breath. Negative for cough, chest tightness and wheezing.   Cardiovascular:  Positive for chest pain. Negative for palpitations.  Gastrointestinal:  Positive for abdominal pain. Negative for  constipation, diarrhea, nausea and vomiting.  Genitourinary:  Positive for flank pain. Negative for dysuria.  Musculoskeletal:  Positive for back pain. Negative for neck pain and neck stiffness.  Skin:  Negative for wound.  Neurological:  Negative for light-headedness and headaches.  Psychiatric/Behavioral:  Negative for agitation.     Physical Exam Updated Vital Signs BP (!) 149/109 (BP Location: Right Arm)   Pulse (!) 102   Temp 98.2 F (36.8 C)   Resp 18   Ht 5\' 5"  (1.651 m)   Wt 90.7 kg   LMP 11/18/2010   SpO2 98%   BMI 33.28 kg/m  Physical Exam Vitals and nursing note reviewed.  Constitutional:      General: She is not in acute distress.    Appearance: She is well-developed. She is not ill-appearing, toxic-appearing or diaphoretic.  HENT:     Head: Normocephalic and atraumatic.  Eyes:     Conjunctiva/sclera: Conjunctivae normal.  Cardiovascular:     Rate and Rhythm: Regular rhythm. Tachycardia present.     Heart sounds: Normal heart sounds. No murmur heard. Pulmonary:     Effort: Pulmonary effort is normal. No respiratory distress.     Breath sounds: Normal breath sounds. No rhonchi or rales.  Chest:     Chest wall: Tenderness present.  Abdominal:     Palpations: Abdomen is soft.     Tenderness: There is abdominal tenderness.  Musculoskeletal:        General: No swelling.     Cervical back: Neck supple.     Right lower leg: No edema.     Left lower leg: No edema.  Skin:    General: Skin is warm and dry.     Capillary Refill: Capillary refill takes less than 2 seconds.  Neurological:     Mental Status: She is alert.  Psychiatric:        Mood and Affect: Mood normal.     ED Results / Procedures / Treatments   Labs (all labs ordered are listed, but only abnormal results are displayed) Labs Reviewed  BASIC METABOLIC PANEL - Abnormal; Notable for the following components:      Result Value   Glucose, Bld 217 (*)    All other components within normal  limits  HEPATIC FUNCTION PANEL - Abnormal; Notable for the following components:   Total Protein 6.4 (*)    Bilirubin, Direct 0.3 (*)    All other components within normal limits  CBC  LIPASE, BLOOD  URINALYSIS, W/ REFLEX TO CULTURE (INFECTION SUSPECTED)  TROPONIN I (HIGH SENSITIVITY)  TROPONIN I (HIGH SENSITIVITY)    EKG EKG Interpretation Date/Time:  Tuesday March 15 2023 10:10:35 EST Ventricular Rate:  81 PR Interval:  128 QRS Duration:  82 QT Interval:  344 QTC Calculation: 399 R Axis:   54  Text Interpretation: Normal sinus rhythm with sinus arrhythmia ST & T wave abnormality, consider inferolateral ischemia Abnormal ECG  When compared with ECG of 14-Apr-2022 14:31, PREVIOUS ECG IS PRESENT when compared to prior, similar appearance No STEMI Confirmed by Theda Belfast ([id number]) on 03/15/2023 12:54:10 PM  Radiology DG Chest 2 View Result Date: 03/15/2023 CLINICAL DATA:  Chest pain EXAM: CHEST - 2 VIEW COMPARISON:  X-ray 08/28/2015 FINDINGS: Mild linear opacity seen midlung zones bilaterally likely scar or atelectasis. No consolidation, pneumothorax or effusion. No edema. Normal cardiopericardial silhouette. Tortuous ectatic aorta. Degenerative changes seen along the spine. IMPRESSION: Linear opacity in the midlung zones bilaterally likely scar or atelectasis. Electronically Signed   By: Karen Kays M.D.   On: 03/15/2023 12:03    Procedures Procedures    Medications Ordered in ED Medications  fentaNYL (SUBLIMAZE) injection 50 mcg (50 mcg Intravenous Given 03/15/23 1353)  iohexol (OMNIPAQUE) 350 MG/ML injection 75 mL (75 mLs Intravenous Contrast Given 03/15/23 1515)    ED Course/ Medical Decision Making/ A&P                                 Medical Decision Making Amount and/or Complexity of Data Reviewed Labs: ordered. Radiology: ordered.  Risk Prescription drug management.    ARMINE RIZZOLO is a 58 y.o. female with a past medical history significant for hypertension,  previous kidney stones, diabetes, ovarian cyst, gallstones, uterine fibroids, and arthritis who presents with left torso pain.  Cording to patient over the last 2 weeks she has had pain in her left side in her left flank/back that has been radiating into the chest.  She reports associated shortness of breath but denies nausea or vomiting.  She denies congestion or cough.  She reports the pain is severe up to 9 out of 10 severity currently.  She reports it is in her left side of the abdomen and flank back into the chest.  She also felt that she had a small lump in her breast but cannot find it now.  She reports no constipation, diarrhea, nausea, vomiting.  Denies urinary changes.  Denies extremity pains.  Denies any trauma.  Denies history of this.  EKG does not show STEMI.  Patient is tachycardic.  On exam, patient does have tenderness in the left back area and left flank of the abdomen.  She also has tenderness in her left chest laterally.  I did not appreciate a breast mass when I evaluated with a chaperone.  No skin changes seen.  No rash to suggest shingles.  She had intact sensation strength and pulses in extremities.  Patient well-appearing otherwise.  No murmur.  Patient had some workup starting in triage and her first troponin was normal.  Will trend.  Given the pain in her left upper abdomen and left flank, will get a lipase and hepatic function.  CBC reassuring.  With her pain in the torso with tachycardia and the pain in the abdomen, will get a CT PE study to rule out pulmonary embolism or some other abnormality in the chest and will also get a CT abdomen pelvis given her history of diverticulosis, history of kidney stones, and this pain that is severe in the right abdomen/flank.  If workup reassuring, suspect it may be musculoskeletal and she is likely stable for outpatient follow-up.   Care transferred to oncoming team to await results of CT imaging and urinalysis.  Anticipate discharge if  workup reassuring.         Final Clinical Impression(s) / ED Diagnoses Final diagnoses:  Atypical chest pain  Left flank pain   Clinical Impression: 1. Atypical chest pain   2. Left flank pain     Disposition: Care transferred to oncoming team to await workup results and reassessment.  This note was prepared with assistance of Conservation officer, historic buildings. Occasional wrong-word or sound-a-like substitutions may have occurred due to the inherent limitations of voice recognition software.       Lechelle Wrigley, Canary Brim, MD 03/15/23 [phone]

## 2023-04-01 ENCOUNTER — Other Ambulatory Visit: Payer: Self-pay | Admitting: Nurse Practitioner

## 2023-04-01 DIAGNOSIS — N644 Mastodynia: Secondary | ICD-10-CM

## 2023-04-27 ENCOUNTER — Ambulatory Visit
Admission: RE | Admit: 2023-04-27 | Discharge: 2023-04-27 | Disposition: A | Discharge status: Home / Self Care | Source: Ambulatory Visit | Attending: Nurse Practitioner | Admitting: Nurse Practitioner

## 2023-04-27 ENCOUNTER — Other Ambulatory Visit

## 2023-04-27 DIAGNOSIS — N644 Mastodynia: Secondary | ICD-10-CM

## 2023-07-01 IMAGING — CT CT ABD-PELV W/ CM
2 of 5 series · 16 of 46 positions shown, 18 images · IV contrast (OMNIPAQUE 350)
Comparison: Ultrasound dated 01/06/2021.

CLINICAL DATA: Acute abdominal pain.

EXAM:
CT ABDOMEN AND PELVIS WITH CONTRAST
TECHNIQUE: Multidetector CT imaging of the abdomen and pelvis was performed
using the standard protocol following bolus administration of
intravenous contrast.
CONTRAST:  80mL OMNIPAQUE IOHEXOL 350 MG/ML SOLN

[Series 2: axial st · axial · 0.71mm/px · z∈[+1058,+1433]mm · 13 of 87 slices shown, 15 images]
[im 6/87  soft-tissue]
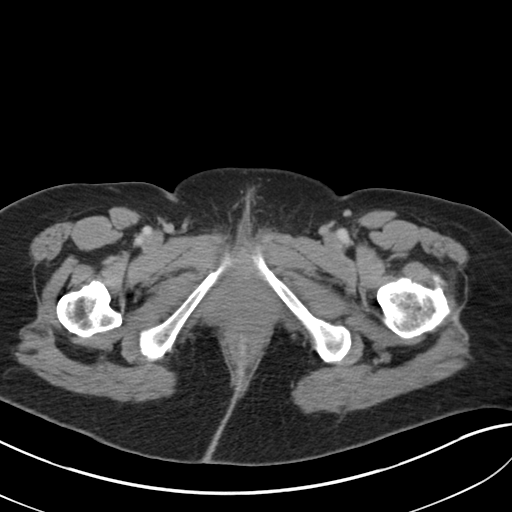
[im 6/87  bone]
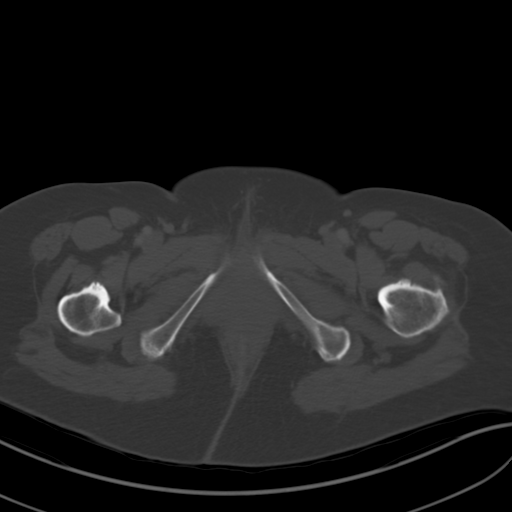
[im 11/87  soft-tissue]
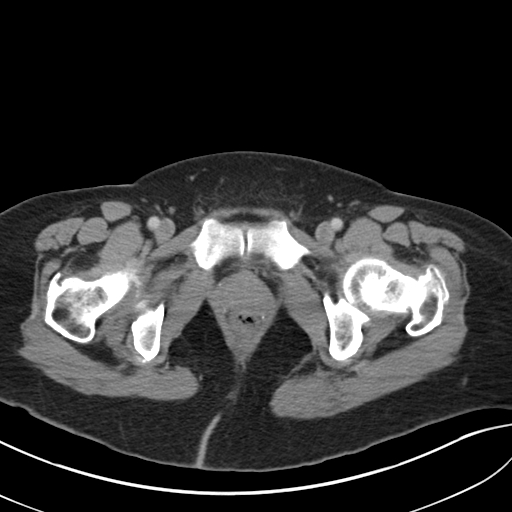
[im 17/87  soft-tissue]
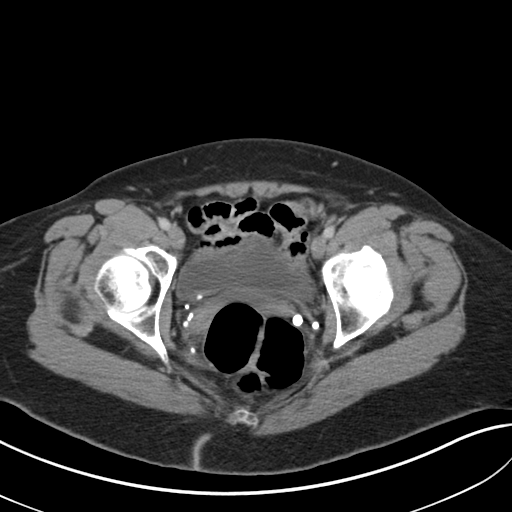
[im 27/87  soft-tissue]
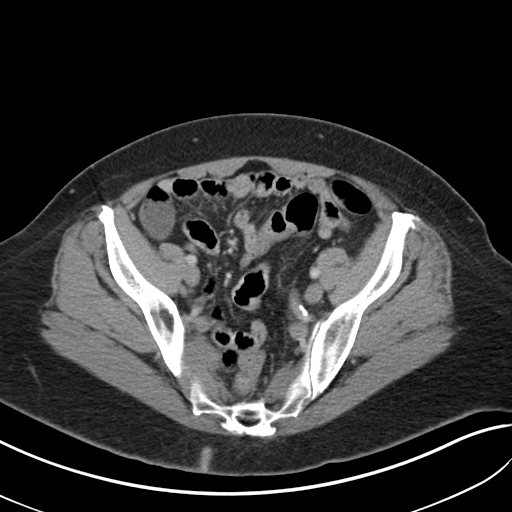
[im 33/87  soft-tissue]
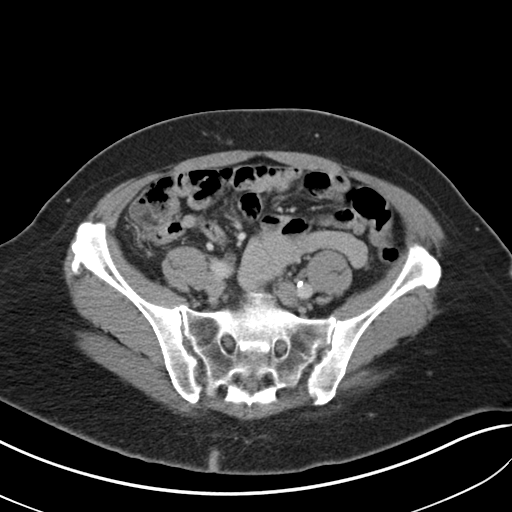
[im 38/87  soft-tissue]
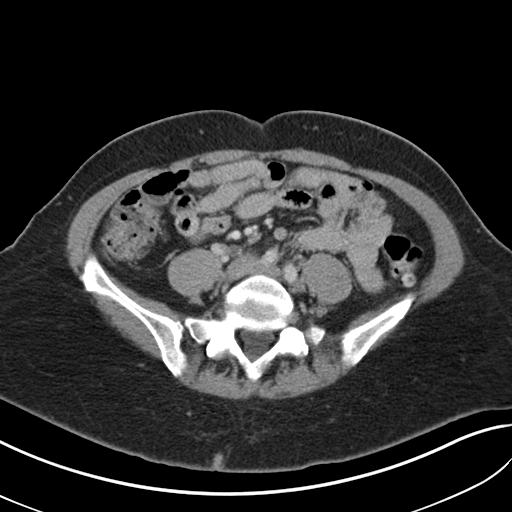
[im 44/87  soft-tissue]
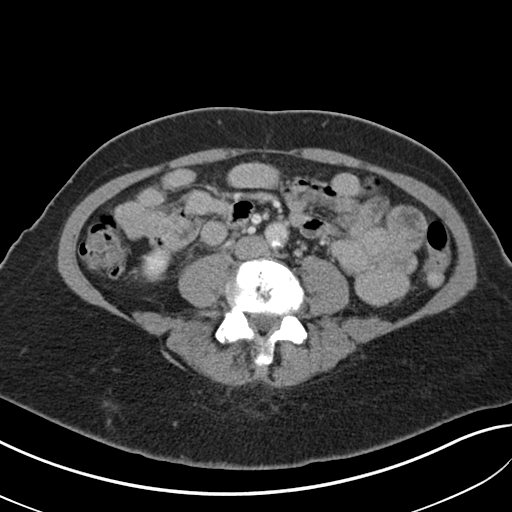
[im 49/87  soft-tissue]
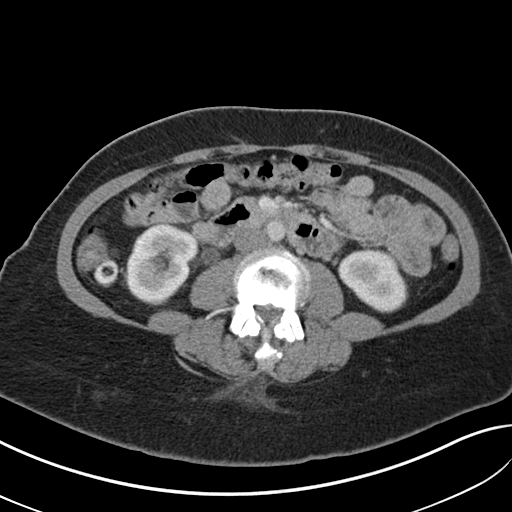
[im 54/87  soft-tissue]
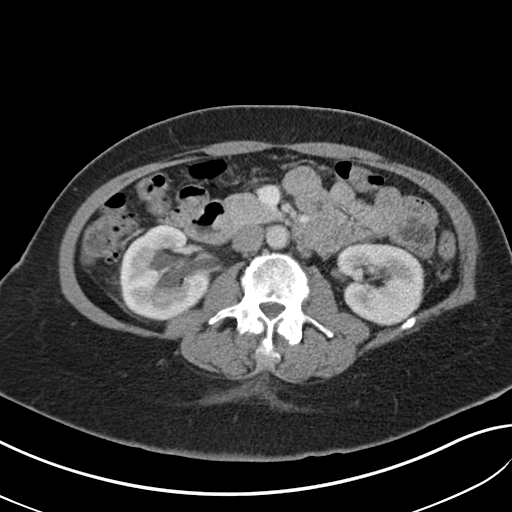
[im 54/87  bone]
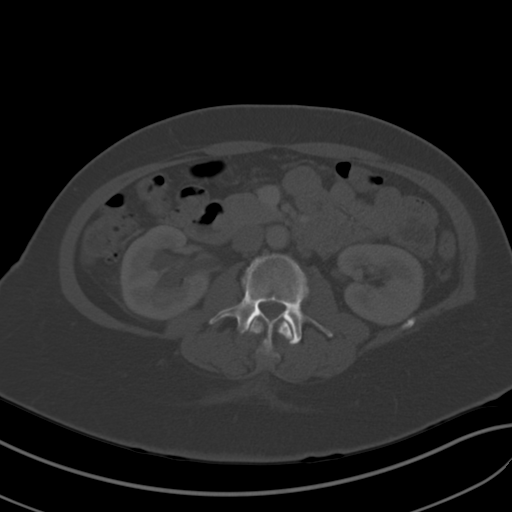
[im 60/87  soft-tissue]
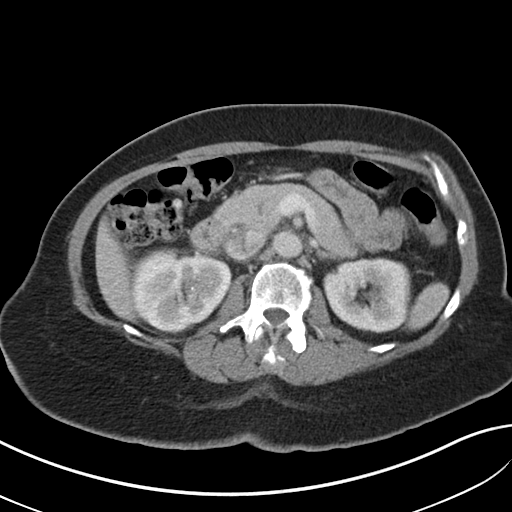
[im 70/87  soft-tissue]
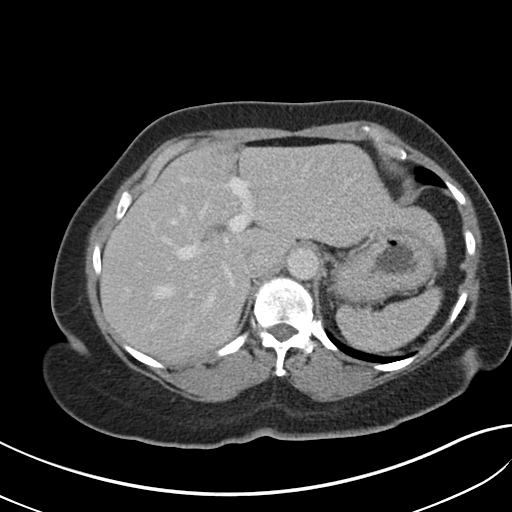
[im 76/87  soft-tissue]
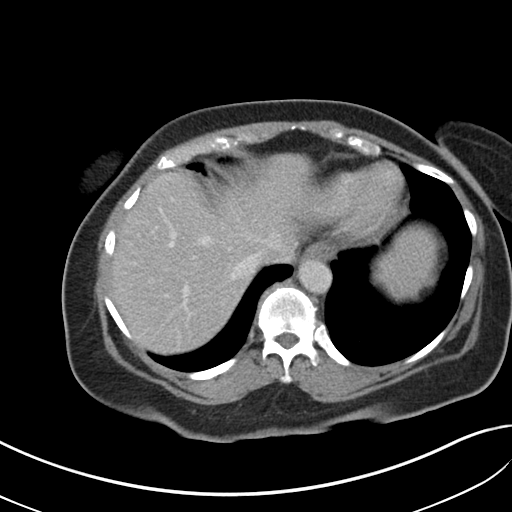
[im 81/87  soft-tissue]
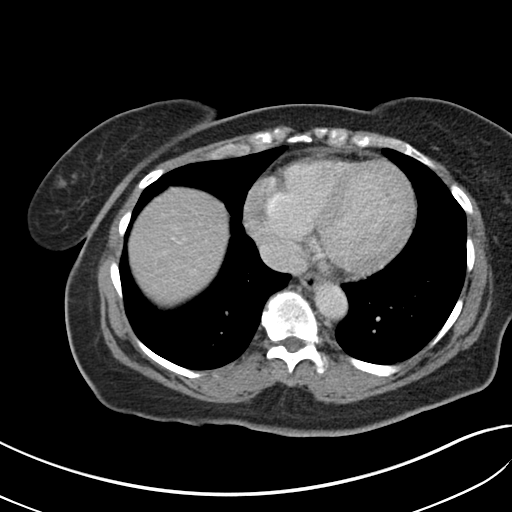

[Series 4: coronal st · coronal · 0.71mm/px · 3 of 123 slices shown]
[im 41/123  soft-tissue]
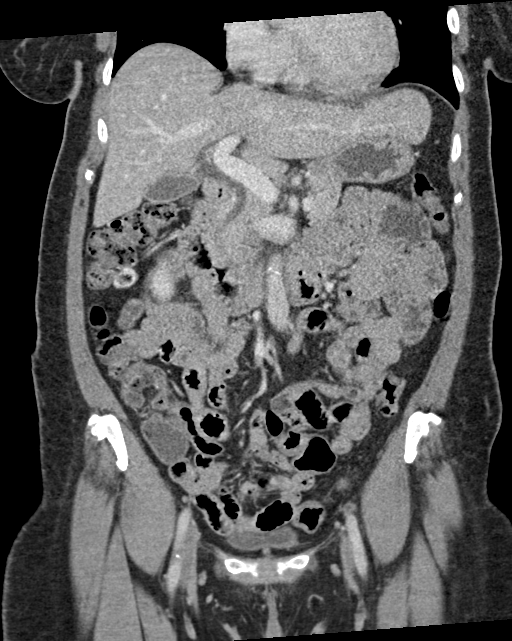
[im 55/123  soft-tissue]
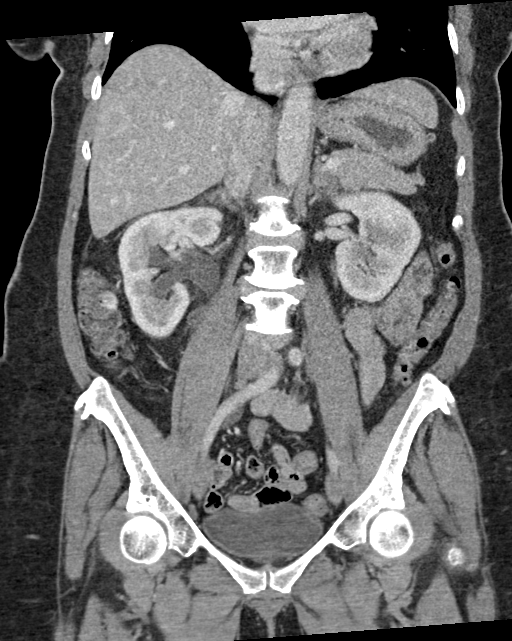
[im 68/123  soft-tissue]
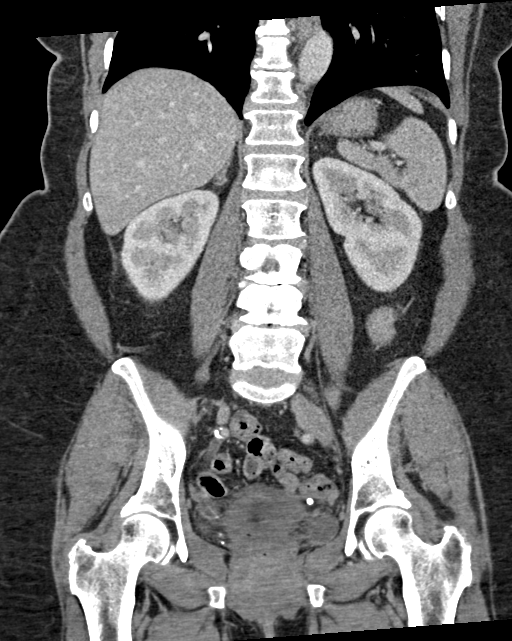

[16 of 46 positions shown; findings below may reference images not displayed]

FINDINGS: Lower chest: The visualized lung bases are clear.

No intra-abdominal free air or free fluid.

Hepatobiliary: No focal liver abnormality is seen. No gallstones,
gallbladder wall thickening, or biliary dilatation.

Pancreas: Unremarkable. No pancreatic ductal dilatation or
surrounding inflammatory changes.

Spleen: Normal in size without focal abnormality.

Adrenals/Urinary Tract: The adrenal glands are unremarkable. There
is a 5 mm calculus at the right ureterovesical junction with mild
right hydronephrosis. There is a 2 mm nonobstructing right renal
upper pole calculus. The left kidney, left ureter, and urinary
bladder appear unremarkable.

Stomach/Bowel: There is colonic diverticulosis without active
inflammatory changes. There is no bowel obstruction or active
inflammation. The appendix is normal.

Vascular/Lymphatic: Mild aortoiliac atherosclerotic disease. The IVC
is unremarkable. No portal venous gas. There is no adenopathy.

Reproductive: Hysterectomy.  No adnexal masses.

Other: None

Musculoskeletal: Degenerative changes of the spine. No acute osseous
pathology.
IMPRESSION: 1. A 5 mm right UVJ calculus with mild right hydronephrosis.
2. A 2 mm nonobstructing right renal upper pole calculus.
3. Colonic diverticulosis. No bowel obstruction. Normal appendix.
4. Aortic Atherosclerosis ([id number]).

## 2023-09-08 ENCOUNTER — Emergency Department (HOSPITAL_COMMUNITY)
Admission: EM | Admit: 2023-09-08 | Discharge: 2023-09-09 | Discharge status: Against Medical Advice | Attending: Emergency Medicine | Admitting: Emergency Medicine

## 2023-09-08 ENCOUNTER — Encounter (HOSPITAL_COMMUNITY): Payer: Self-pay

## 2023-09-08 ENCOUNTER — Other Ambulatory Visit: Payer: Self-pay

## 2023-09-08 ENCOUNTER — Emergency Department (HOSPITAL_COMMUNITY)

## 2023-09-08 DIAGNOSIS — M549 Dorsalgia, unspecified: Secondary | ICD-10-CM | POA: Diagnosis present

## 2023-09-08 DIAGNOSIS — M25512 Pain in left shoulder: Secondary | ICD-10-CM | POA: Insufficient documentation

## 2023-09-08 DIAGNOSIS — R202 Paresthesia of skin: Secondary | ICD-10-CM | POA: Insufficient documentation

## 2023-09-08 DIAGNOSIS — R531 Weakness: Secondary | ICD-10-CM | POA: Diagnosis not present

## 2023-09-08 DIAGNOSIS — M79602 Pain in left arm: Secondary | ICD-10-CM | POA: Diagnosis not present

## 2023-09-08 DIAGNOSIS — Z7984 Long term (current) use of oral hypoglycemic drugs: Secondary | ICD-10-CM | POA: Insufficient documentation

## 2023-09-08 DIAGNOSIS — E119 Type 2 diabetes mellitus without complications: Secondary | ICD-10-CM | POA: Insufficient documentation

## 2023-09-08 NOTE — ED Triage Notes (Signed)
 Patient reports hx of cyst in left shoulder area that her doctor told her would grow back, now reports that she believes it is and is causing pain in her left shoulder, down her arm and into hand.

## 2023-09-09 DIAGNOSIS — M549 Dorsalgia, unspecified: Secondary | ICD-10-CM | POA: Diagnosis not present

## 2023-09-09 LAB — TROPONIN I (HIGH SENSITIVITY): Troponin I (High Sensitivity): 5 ng/L (ref <18)

## 2023-09-09 LAB — CBC WITH DIFFERENTIAL/PLATELET
Abs Immature Granulocytes: 0.01 K/uL (ref 0.00–0.07)
Basophils Absolute: 0 K/uL (ref 0.0–0.1)
Basophils Relative: 0 %
Eosinophils Absolute: 0.1 K/uL (ref 0.0–0.5)
Eosinophils Relative: 1 %
HCT: 44.3 % (ref 36.0–46.0)
Hemoglobin: 14.4 g/dL (ref 12.0–15.0)
Immature Granulocytes: 0 %
Lymphocytes Relative: 50 %
Lymphs Abs: 2.8 K/uL (ref 0.7–4.0)
MCH: 30.8 pg (ref 26.0–34.0)
MCHC: 32.5 g/dL (ref 30.0–36.0)
MCV: 94.9 fL (ref 80.0–100.0)
Monocytes Absolute: 0.5 K/uL (ref 0.1–1.0)
Monocytes Relative: 8 %
Neutro Abs: 2.4 K/uL (ref 1.7–7.7)
Neutrophils Relative %: 41 %
Platelets: 175 K/uL (ref 150–400)
RBC: 4.67 MIL/uL (ref 3.87–5.11)
RDW: 13.2 % (ref 11.5–15.5)
WBC: 5.8 K/uL (ref 4.0–10.5)
nRBC: 0 % (ref 0.0–0.2)

## 2023-09-09 LAB — BASIC METABOLIC PANEL WITH GFR
Anion gap: 14 (ref 5–15)
BUN: 11 mg/dL (ref 6–20)
CO2: 23 mmol/L (ref 22–32)
Calcium: 9.4 mg/dL (ref 8.9–10.3)
Chloride: 101 mmol/L (ref 98–111)
Creatinine, Ser: 0.61 mg/dL (ref 0.44–1.00)
GFR, Estimated: >60 mL/min (ref >60)
Glucose, Bld: 132 mg/dL — ABNORMAL HIGH (ref 70–99)
Potassium: 3.3 mmol/L — ABNORMAL LOW (ref 3.5–5.1)
Sodium: 138 mmol/L (ref 135–145)

## 2023-09-09 LAB — D-DIMER, QUANTITATIVE: D-Dimer, Quant: <0.27 ug{FEU}/mL (ref 0.00–0.50)

## 2023-09-09 MED ORDER — KETOROLAC TROMETHAMINE 15 MG/ML IJ SOLN
15.0000 mg | Freq: Once | INTRAMUSCULAR | Status: AC
  Administered 2023-09-09: 15 mg via INTRAVENOUS
  Filled 2023-09-09: qty 1

## 2023-09-09 NOTE — ED Notes (Signed)
 Pt stated that she wanted to leave. She refused to sign out AMA . Pt very hostile. Stated that she has been here since 6pm and was given candy for pain medication. Pt also stated that she wanted to visit her urgent care this morning due to how long she has been here. Explained to pt the process of an ER visit. Pt stated she did not want to stay and that she did not want to speak to anyone else. Stated she will speak to her lawyer in the morning.

## 2023-09-09 NOTE — ED Provider Notes (Addendum)
 [place] EMERGENCY DEPARTMENT AT [facility] Provider Note   CSN: [id number] Arrival date & time: 09/08/23  8169     Patient presents with: Back Pain and Shoulder Pain   Elizabeth Steele is a 58 y.o. female.   Patient with history of diabetes here with pain and numbness and weakness of her left shoulder, upper back, and left arm.  States she had a cyst to her left upper back that was removed about 25 years ago.  She does not know the etiology of this but states it was not cancerous.  Over the past 3 months she has had increased pain in the same area with pain that radiates down her left arm with numbness and tingling.  She is concerned the cyst is coming back, and that is what was causing her pain.  Having pain in the left side of her neck as well.  Has not taken anything at home for pain. Over the past 7 to 10 days the pain has become unbearable and starts in her left neck and upper back and radiates down her left arm with numbness and weakness.  No chest pain or shortness of breath.  No fever, chills, nausea or vomiting.  Mild serous drainage from the area of her back but no significant drainage.  No cough or fever.  She believes the pain in her arm is coming from the cyst but she is having some neck pain as well.  She does feel weak in the left arm with some numbness and tingling.  The history is provided by the patient.  Back Pain Associated symptoms: numbness and weakness   Associated symptoms: no abdominal pain, no chest pain, no dysuria, no fever and no headaches   Shoulder Pain Associated symptoms: back pain   Associated symptoms: no fever        Prior to Admission medications   Medication Sig Start Date End Date Taking? Authorizing Provider  amLODipine  (NORVASC ) 10 MG tablet Take 1 tablet (10 mg total) by mouth daily. 07/18/20   Hazen Darryle BRAVO, FNP  BAYER MICROLET LANCETS lancets Use as instructed 10/14/12   Wenzel, Julie N, PA-C  cyclobenzaprine (FLEXERIL) 5 MG  tablet Take 5 mg by mouth 3 (three) times daily as needed for muscle spasms.    [provider]  EPINEPHrine 0.3 mg/0.3 mL IJ SOAJ injection Inject 0.3 mg into the muscle as needed for anaphylaxis. Patient not taking: Reported on 01/20/2021    [provider]  fluticasone  (FLONASE ) 50 MCG/ACT nasal spray Place 2 sprays into both nostrils daily. 02/08/22   Lynwood Lenis, PA-C  glucose blood [facility] NEXT TEST) test strip Use as instructed 10/14/12   Wenzel, Julie N, PA-C  hydrochlorothiazide  (HYDRODIURIL ) 12.5 MG tablet Take 2 tablets (25 mg total) by mouth daily. Patient taking differently: Take 12.5 mg by mouth daily. 07/18/20   Hazen Darryle BRAVO, FNP  ketorolac  (TORADOL ) 10 MG tablet Take 1 tablet (10 mg total) by mouth every 6 (six) hours as needed. 03/15/23   Ula Prentice SAUNDERS, MD  levocetirizine (XYZAL ) 5 MG tablet Take 1 tablet (5 mg total) by mouth every evening. For allergies. 02/08/22   Lynwood Lenis, PA-C  meclizine  (ANTIVERT ) 12.5 MG tablet Take 1 tablet (12.5 mg total) by mouth 3 (three) times daily as needed for dizziness. 04/14/22   Sridharan, Sriramkumar, MD  Melatonin 10 MG TABS Take by mouth as needed.    [provider]  metFORMIN  (GLUCOPHAGE ) 500 MG tablet Take  2 tablets (1,000 mg total) by mouth 2 (two) times daily with a meal. 07/18/20   Mound, Darryle BRAVO, FNP  methocarbamol  (ROBAXIN ) 750 MG tablet Take 1 tablet (750 mg total) by mouth 4 (four) times daily. 03/15/23   Ula Prentice SAUNDERS, MD  ondansetron  (ZOFRAN ) 4 MG tablet Take 1 tablet (4 mg total) by mouth every 8 (eight) hours as needed for nausea or vomiting. 01/06/21   Muthersbaugh, Chiquita, PA-C  oxyCODONE  (ROXICODONE ) 5 MG immediate release tablet Take 1 tablet (5 mg total) by mouth every 6 (six) hours as needed for severe pain. 01/06/21   Muthersbaugh, Chiquita, PA-C  predniSONE  (DELTASONE ) 10 MG tablet Take 1 tablet (10 mg total) by mouth in the morning, at noon, and at bedtime. 02/08/22   Lynwood Lenis, PA-C  pregabalin  (LYRICA) 75 MG capsule Take 75 mg by mouth as needed.    [provider]  tamsulosin  (FLOMAX ) 0.4 MG CAPS capsule Take 1 capsule (0.4 mg total) by mouth 2 (two) times daily. 01/06/21   Muthersbaugh, Chiquita, PA-C  traZODone  (DESYREL ) 50 MG tablet Take 2 tablets (100 mg total) by mouth at bedtime. Patient taking differently: Take 50 mg by mouth at bedtime as needed. 07/18/20   Hazen Darryle BRAVO, FNP    Allergies: Shellfish allergy and Bactrim  [sulfamethoxazole -trimethoprim ]    Review of Systems  Constitutional:  Negative for activity change, appetite change and fever.  HENT:  Negative for congestion.   Respiratory:  Negative for cough, chest tightness and shortness of breath.   Cardiovascular:  Negative for chest pain.  Gastrointestinal:  Negative for abdominal pain, nausea and vomiting.  Genitourinary:  Negative for dysuria and hematuria.  Musculoskeletal:  Positive for back pain.  Skin:  Negative for rash.  Neurological:  Positive for weakness and numbness. Negative for dizziness and headaches.   all other systems are negative except as noted in the HPI and PMH.    Updated Vital Signs BP (!) 153/101 (BP Location: Right Arm)   Pulse 86   Temp 97.8 F (36.6 C) (Oral)   Resp 20   Ht 5' 5 (1.651 m)   Wt 81.6 kg   LMP 11/18/2010   SpO2 100%   BMI 29.95 kg/m   Physical Exam Vitals and nursing note reviewed.  Constitutional:      General: She is not in acute distress.    Appearance: She is well-developed.  HENT:     Head: Normocephalic and atraumatic.     Mouth/Throat:     Pharynx: No oropharyngeal exudate.  Eyes:     Conjunctiva/sclera: Conjunctivae normal.     Pupils: Pupils are equal, round, and reactive to light.  Neck:     Comments: No meningismus. Cardiovascular:     Rate and Rhythm: Normal rate and regular rhythm.     Heart sounds: Normal heart sounds. No murmur heard. Pulmonary:     Effort: Pulmonary effort is normal. No respiratory distress.     Breath  sounds: Normal breath sounds.  Abdominal:     Palpations: Abdomen is soft.     Tenderness: There is no abdominal tenderness. There is no guarding or rebound.  Musculoskeletal:        General: No tenderness. Normal range of motion.     Cervical back: Normal range of motion and neck supple.     Comments: Left upper back with well-healed surgical scar with small abrasion.  No fluctuance or erythema.    Skin:    General: Skin is warm.  Neurological:     Mental Status: She is alert and oriented to person, place, and time.     Cranial Nerves: No cranial nerve deficit.     Motor: No abnormal muscle tone.     Coordination: Coordination normal.     Comments: Slightly Decreased grip strength on the left.  Slight Weakness with forearm flexion and extension on the left.  Shoulder shrug equal.  No facial droop.   Psychiatric:        Behavior: Behavior normal.     (all labs ordered are listed, but only abnormal results are displayed) Labs Reviewed  BASIC METABOLIC PANEL WITH GFR - Abnormal; Notable for the following components:      Result Value   Potassium 3.3 (*)    Glucose, Bld 132 (*)    All other components within normal limits  CBC WITH DIFFERENTIAL/PLATELET  D-DIMER, QUANTITATIVE  TROPONIN I (HIGH SENSITIVITY)  TROPONIN I (HIGH SENSITIVITY)    EKG: EKG Interpretation Date/Time:  Friday September 09 2023 01:20:25 EDT Ventricular Rate:  82 PR Interval:  145 QRS Duration:  97 QT Interval:  364 QTC Calculation: 426 R Axis:   47  Text Interpretation: Sinus rhythm Probable left atrial enlargement Nonspecific T abnormalities, lateral leads No significant change was found Confirmed by Carita Senior [phone]) on 09/09/2023 1:25:53 AM  Radiology: [facility] Shoulder Left Result Date: 09/08/2023 CLINICAL DATA:  Shoulder pain EXAM: LEFT SHOULDER - 2+ VIEW COMPARISON:  None Available. FINDINGS: There is no evidence of fracture or dislocation. Tiny synovial calcification adjacent to humeral head.  Mild degenerative changes of the acromioclavicular joint. There is no evidence of other arthropathy or other focal bone abnormality. Soft tissues are unremarkable. IMPRESSION: Mild degenerative changes of the acromioclavicular joint and small synovial calcification. Electronically Signed   By: Megan  Zare M.D.   On: 09/08/2023 19:21     Procedures   Medications Ordered in the ED  ketorolac  (TORADOL ) 15 MG/ML injection 15 mg (has no administration in time range)                                    Medical Decision Making Amount and/or Complexity of Data Reviewed Labs: ordered. Decision-making details documented in ED Course. Radiology: ordered and independent interpretation performed. Decision-making details documented in ED Course. ECG/medicine tests: ordered and independent interpretation performed. Decision-making details documented in ED Course.  Risk Prescription drug management.   Left upper neck and back pain with history of previous cyst removal.  She does have some slightly decreased strength in her left arm.  No chest pain or shortness of breath.  Wound to upper back appears to be healing well without evidence of fluctuance or abscess.  Query whether she has some cervical radiculopathy.  Low concern for ACS  EKG is sinus rhythm with low concern for ACS.  No acute ST changes. X-ray obtained in triage of left shoulder shows evidence of arthritis without bony abnormality.  Labs reassuring.  Troponin negative.  D-dimer negative. Low concern for ACS, pulmonary embolism, aortic dissection  Pain may be due to previous area of surgery.  She does have some decreased grip strength in her left arm.  She is having some numbness and tingling going down her left arm and shoulder.  This may be from her previous area of surgery due to the mass but cannot rule out cervical radiculopathy.  Plan to obtain MRI to evaluate for cervical  radiculopathy and rule out any cervical cord  compression.  Patient apparently became upset with wait time and decided to leave.  She told nursing staff she was upset with waiting and her pain was not controlled with Toradol .  Not able to speak with patient before she left.  Informed by nursing staff that patient left.  She was apparently upset with the wait time and decided to leave.  Not able to speak with patient prior to her leaving.     Final diagnoses:  Upper back pain on left side    ED Discharge Orders     None          Ura Hausen, Garnette, MD 09/09/23 9749    Carita Garnette, MD 09/09/23 [phone]

## 2023-09-10 ENCOUNTER — Encounter (HOSPITAL_BASED_OUTPATIENT_CLINIC_OR_DEPARTMENT_OTHER): Payer: Self-pay | Admitting: Emergency Medicine

## 2023-09-10 ENCOUNTER — Emergency Department (HOSPITAL_BASED_OUTPATIENT_CLINIC_OR_DEPARTMENT_OTHER)

## 2023-09-10 ENCOUNTER — Emergency Department (HOSPITAL_BASED_OUTPATIENT_CLINIC_OR_DEPARTMENT_OTHER)
Admission: EM | Admit: 2023-09-10 | Discharge: 2023-09-10 | Disposition: A | Discharge status: Home / Self Care | Attending: Emergency Medicine | Admitting: Emergency Medicine

## 2023-09-10 ENCOUNTER — Other Ambulatory Visit: Payer: Self-pay

## 2023-09-10 DIAGNOSIS — F17210 Nicotine dependence, cigarettes, uncomplicated: Secondary | ICD-10-CM | POA: Diagnosis not present

## 2023-09-10 DIAGNOSIS — M542 Cervicalgia: Secondary | ICD-10-CM | POA: Diagnosis present

## 2023-09-10 DIAGNOSIS — I10 Essential (primary) hypertension: Secondary | ICD-10-CM | POA: Insufficient documentation

## 2023-09-10 DIAGNOSIS — Z79899 Other long term (current) drug therapy: Secondary | ICD-10-CM | POA: Insufficient documentation

## 2023-09-10 DIAGNOSIS — Z7984 Long term (current) use of oral hypoglycemic drugs: Secondary | ICD-10-CM | POA: Insufficient documentation

## 2023-09-10 DIAGNOSIS — E119 Type 2 diabetes mellitus without complications: Secondary | ICD-10-CM | POA: Insufficient documentation

## 2023-09-10 DIAGNOSIS — M792 Neuralgia and neuritis, unspecified: Secondary | ICD-10-CM

## 2023-09-10 DIAGNOSIS — M5412 Radiculopathy, cervical region: Secondary | ICD-10-CM | POA: Diagnosis not present

## 2023-09-10 MED ORDER — METHYLPREDNISOLONE SODIUM SUCC 125 MG IJ SOLR
125.0000 mg | Freq: Once | INTRAMUSCULAR | Status: AC
  Administered 2023-09-10: 125 mg via INTRAVENOUS
  Filled 2023-09-10: qty 2

## 2023-09-10 MED ORDER — OXYCODONE HCL 5 MG PO TABS
5.0000 mg | ORAL_TABLET | Freq: Four times a day (QID) | ORAL | 0 refills | Status: AC | PRN
Start: 2023-09-10 — End: ?

## 2023-09-10 MED ORDER — MORPHINE SULFATE (PF) 4 MG/ML IV SOLN
4.0000 mg | Freq: Once | INTRAVENOUS | Status: AC
  Administered 2023-09-10: 4 mg via INTRAVENOUS
  Filled 2023-09-10: qty 1

## 2023-09-10 MED ORDER — PREDNISONE 10 MG (21) PO TBPK: ORAL_TABLET | Freq: Every day | ORAL | 0 refills | Status: AC

## 2023-09-10 NOTE — ED Triage Notes (Signed)
 Pt c/o pain to LT shoulder with numbness to LUE x 2 wks; was seen 2d ago for same, but apparently left AMA d/t wait time

## 2023-09-10 NOTE — ED Provider Notes (Signed)
 [facility] Provider Note   CSN: [id number] Arrival date & time: 09/10/23  1538     Patient presents with: Shoulder Pain   Elizabeth Steele is a 58 y.o. female.    Shoulder Pain   58 year old female presents emergency department with complaints of left-sided neck pain radiates down the entire left arm, left arm numbness and feelings of left arm weakness.  States that she woke up 1 morning 2 weeks ago and symptoms are present since then.  Has tried multiple over-the-counter medications without significant improvement.  Was seen 2 days ago for similar symptoms.  Had thorough workup at that time.  Desire to transfer for MRI of cervical spine for patient's radicular symptoms but left AMA due to prolonged wait.  States that she was treated with Toradol  and did have improvement of symptoms temporarily that night into the next morning.  Denies any fevers, chills, chest pain, shortness of breath, exertional worsening of symptoms.  Regarding weakness, patient states that it hurts with any movement and is unsure whether or not it is truly weakness or if she is having limitations due to the pain.  States she she did have history of cyst in her left upper back that was removed several years ago.  States that she was having some radicular symptoms at that time prior to having it removed and feels like it is back.  Has been putting topical patches/numbing agents over the area which has not helped very much.  Past medical history significant for PAD, hypertension, diabetes mellitus type 2, anemia, gout  Prior to Admission medications   Medication Sig Start Date End Date Taking? Authorizing Provider  amLODipine  (NORVASC ) 10 MG tablet Take 1 tablet (10 mg total) by mouth daily. 07/18/20   Hazen Darryle BRAVO, FNP  BAYER MICROLET LANCETS lancets Use as instructed 10/14/12   Wenzel, Julie N, PA-C  cyclobenzaprine (FLEXERIL) 5 MG tablet Take 5 mg by mouth 3 (three) times  daily as needed for muscle spasms.    [provider]  EPINEPHrine 0.3 mg/0.3 mL IJ SOAJ injection Inject 0.3 mg into the muscle as needed for anaphylaxis. Patient not taking: Reported on 01/20/2021    [provider]  fluticasone  (FLONASE ) 50 MCG/ACT nasal spray Place 2 sprays into both nostrils daily. 02/08/22   Lynwood Lenis, PA-C  glucose blood [facility] NEXT TEST) test strip Use as instructed 10/14/12   Wenzel, Julie N, PA-C  hydrochlorothiazide  (HYDRODIURIL ) 12.5 MG tablet Take 2 tablets (25 mg total) by mouth daily. Patient taking differently: Take 12.5 mg by mouth daily. 07/18/20   Hazen Darryle BRAVO, FNP  ketorolac  (TORADOL ) 10 MG tablet Take 1 tablet (10 mg total) by mouth every 6 (six) hours as needed. 03/15/23   Ula Prentice SAUNDERS, MD  levocetirizine (XYZAL ) 5 MG tablet Take 1 tablet (5 mg total) by mouth every evening. For allergies. 02/08/22   Lynwood Lenis, PA-C  meclizine  (ANTIVERT ) 12.5 MG tablet Take 1 tablet (12.5 mg total) by mouth 3 (three) times daily as needed for dizziness. 04/14/22   Sridharan, Sriramkumar, MD  Melatonin 10 MG TABS Take by mouth as needed.    [provider]  metFORMIN  (GLUCOPHAGE ) 500 MG tablet Take 2 tablets (1,000 mg total) by mouth 2 (two) times daily with a meal. 07/18/20   Mound, Darryle BRAVO, FNP  methocarbamol  (ROBAXIN ) 750 MG tablet Take 1 tablet (750 mg total) by mouth 4 (four) times daily. 03/15/23   Ula Prentice  R, MD  ondansetron  (ZOFRAN ) 4 MG tablet Take 1 tablet (4 mg total) by mouth every 8 (eight) hours as needed for nausea or vomiting. 01/06/21   Muthersbaugh, Chiquita, PA-C  oxyCODONE  (ROXICODONE ) 5 MG immediate release tablet Take 1 tablet (5 mg total) by mouth every 6 (six) hours as needed for severe pain. 01/06/21   Muthersbaugh, Chiquita, PA-C  predniSONE  (DELTASONE ) 10 MG tablet Take 1 tablet (10 mg total) by mouth in the morning, at noon, and at bedtime. 02/08/22   Lynwood Lenis, PA-C  pregabalin (LYRICA) 75 MG capsule Take 75 mg by mouth  as needed.    [provider]  tamsulosin  (FLOMAX ) 0.4 MG CAPS capsule Take 1 capsule (0.4 mg total) by mouth 2 (two) times daily. 01/06/21   Muthersbaugh, Chiquita, PA-C  traZODone  (DESYREL ) 50 MG tablet Take 2 tablets (100 mg total) by mouth at bedtime. Patient taking differently: Take 50 mg by mouth at bedtime as needed. 07/18/20   Hazen Darryle BRAVO, FNP    Allergies: Shellfish allergy and Bactrim  [sulfamethoxazole -trimethoprim ]    Review of Systems  All other systems reviewed and are negative.   Updated Vital Signs BP (!) 141/102 (BP Location: Right Arm)   Pulse (!) 118   Temp 98.6 F (37 C) (Oral)   Resp 20   Ht 5' 5 (1.651 m)   Wt 81.6 kg   LMP 11/18/2010   SpO2 98%   BMI 29.95 kg/m   Physical Exam Vitals and nursing note reviewed.  Constitutional:      General: She is not in acute distress.    Appearance: She is well-developed.  HENT:     Head: Normocephalic and atraumatic.  Eyes:     Conjunctiva/sclera: Conjunctivae normal.  Cardiovascular:     Rate and Rhythm: Normal rate and regular rhythm.     Heart sounds: No murmur heard. Pulmonary:     Effort: Pulmonary effort is normal. No respiratory distress.     Breath sounds: Normal breath sounds.  Abdominal:     Palpations: Abdomen is soft.     Tenderness: There is no abdominal tenderness.  Musculoskeletal:        General: No swelling.     Cervical back: Neck supple.     Comments: Mild midline tenderness lower cervical spine.  Paraspinal tenderness in the left cervical region with extension down left trapezial ridge.  Scar from prior incision present left upper back with small scab-like area in the middle.  Bedside ultrasound showed no obvious fluid collection.  Patient with limited range of motion especially of left shoulder secondary to pain.  Grip slightly decreased left compared to right.  Elbow flexion/extension slightly decreased left compared to right.  Radial pulses 2+ bilaterally.  Difficulty assessing  shoulder strength due to patient's discomfort.  Skin:    General: Skin is warm and dry.     Capillary Refill: Capillary refill takes less than 2 seconds.  Neurological:     Mental Status: She is alert.  Psychiatric:        Mood and Affect: Mood normal.     (all labs ordered are listed, but only abnormal results are displayed) Labs Reviewed - No data to display  EKG: None  Radiology: DG Shoulder Left Result Date: 09/08/2023 CLINICAL DATA:  Shoulder pain EXAM: LEFT SHOULDER - 2+ VIEW COMPARISON:  None Available. FINDINGS: There is no evidence of fracture or dislocation. Tiny synovial calcification adjacent to humeral head. Mild degenerative changes of the acromioclavicular joint. There is no  evidence of other arthropathy or other focal bone abnormality. Soft tissues are unremarkable. IMPRESSION: Mild degenerative changes of the acromioclavicular joint and small synovial calcification. Electronically Signed   By: Megan  Zare M.D.   On: 09/08/2023 19:21     Procedures   Medications Ordered in the ED - No data to display                                  Medical Decision Making Amount and/or Complexity of Data Reviewed Radiology: ordered.  Risk Prescription drug management.   This patient presents to the ED for concern of left arm/neck pain, this involves an extensive number of treatment options, and is a complaint that carries with it a high risk of complications and morbidity.  The differential diagnosis includes ACS, PE, pneumothorax,, arterial dissection, cervical radiculopathy, CVA, MS, other   Co morbidities that complicate the patient evaluation  See HPI   Additional history obtained:  Additional history obtained from EMR External records from outside source obtained and reviewed including hospital records patient did have extensive workup 2 days ago for this including D-dimer which was negative, negative troponins, normal EKG, left shoulder/chest imaging which were  reassuring.   Lab Tests:  N/a   Imaging Studies ordered:  I ordered imaging studies including CT cervical spine I independently visualized and interpreted imaging which showed moderate right foraminal narrowing C5-C6.  Moderate to severe left and moderate right foraminal narrowing at C6-C7.  Left-sided facet hypertrophy C7/T1 with moderate left foraminal narrowing. I agree with the radiologist interpretation  Cardiac Monitoring: / EKG:  The patient was maintained on a cardiac monitor.  I personally viewed and interpreted the cardiac monitored which showed an underlying rhythm of: sinus rhythm   Consultations Obtained:  N/a   Problem List / ED Course / Critical interventions / Medication management  Cervical radiculopathy I ordered medication including morphine , solumedrol    Reevaluation of the patient after these medicines showed that the patient improved I have reviewed the patients home medicines and have made adjustments as needed   Social Determinants of Health:  Chronic cigarette use.  Denies illicit drug use.   Test / Admission - Considered:  Cervical radiculopathy Vitals signs within normal range and stable throughout visit. Laboratory/imaging studies significant for: See above  58 year old female presents emergency department with complaints of left-sided neck pain radiates down the entire left arm, left arm numbness and feelings of left arm weakness.  States that she woke up 1 morning 2 weeks ago and symptoms are present since then.  Has tried multiple over-the-counter medications without significant improvement.  Was seen 2 days ago for similar symptoms.  Had thorough workup at that time.  Desire to transfer for MRI of cervical spine for patient's radicular symptoms but left AMA due to prolonged wait.  States that she was treated with Toradol  and did have improvement of symptoms temporarily that night into the next morning.  Denies any fevers, chills, chest pain,  shortness of breath, exertional worsening of symptoms.  Regarding weakness, patient states that it hurts with any movement and is unsure whether or not it is truly weakness or if she is having limitations due to the pain.  States she she did have history of cyst in her left upper back that was removed several years ago.  States that she was having some radicular symptoms at that time prior to having it removed and feels  like it is back.  Has been putting topical patches/numbing agents over the area which has not helped very much. On exam, cervical spine tenderness as well as left paraspinal cervical tenderness tenderness along left trapezial ridge.  Initially, decreased grip strength, elbow flexion/extension on the left when compared to the right.  Difficulty assessing left-sided shoulder strength/ability due to discomfort.  Patient did have very thorough cardiac workup 2 days ago when presenting with the same symptoms, troponins negative, EKG, chest x-ray reassuring.  Given patient's continued symptoms, nonexertional in nature, low suspicion for ACS so workup was not repeated especially given that it was 2 days ago.  Had D-dimer at that time which was negative as well.  Patient without chest pain rating to back, pulse deficits, neurodeficits, hypertension; low suspicion for arterial dissection.  CT imaging was obtained to stage and cervical spine here in the emergency department which did show significant narrowing, significant C6-C7 on the left.  Treated with medication for pain as well as steroid in the emergency department with improvement with strength during exam although still with some decreased grip strength on the left.  Offered patient emergent MRI imaging of cervical spine for further evaluation regarding potential nerve/cord compression especially given left upper extremity weakness but she declined.  States that she will not wait for hours upon hours in [facility] or [facility] ED waiting MRI imaging.  I  offered consultation to neurosurgery in the ED but this was as well deferred.  States that she would prefer to follow-up outpatient with neurosurgery after increased risks of morbidity/potentially mortality were discussed with patient.  Will trial prednisone  taper, medication for pain and refer patient into neurosurgery. Worrisome signs and symptoms were discussed with the patient, and the patient acknowledged understanding to return to the ED if noticed. Patient was stable upon discharge.       Final diagnoses:  None    ED Discharge Orders     None          Silver Wonda LABOR, [place] 09/10/23 1753    Emil Share, DO 09/10/23 1958

## 2023-09-10 NOTE — Discharge Instructions (Addendum)
 As discussed, CT imaging of your neck showed multiple areas of stenosis which could be causing her symptoms especially on the left.  Will send you home with a prednisone  taper as well as medicine for pain.  Recommend follow-up with neurosurgery in the outpatient setting.

## 2023-09-22 ENCOUNTER — Encounter (HOSPITAL_COMMUNITY): Payer: Self-pay

## 2023-09-22 ENCOUNTER — Emergency Department (HOSPITAL_COMMUNITY)
Admission: EM | Admit: 2023-09-22 | Discharge: 2023-09-22 | Discharge status: Against Medical Advice | Attending: Emergency Medicine | Admitting: Emergency Medicine

## 2023-09-22 ENCOUNTER — Emergency Department (HOSPITAL_COMMUNITY)

## 2023-09-22 ENCOUNTER — Other Ambulatory Visit: Payer: Self-pay

## 2023-09-22 DIAGNOSIS — R200 Anesthesia of skin: Secondary | ICD-10-CM | POA: Insufficient documentation

## 2023-09-22 DIAGNOSIS — M79662 Pain in left lower leg: Secondary | ICD-10-CM | POA: Diagnosis not present

## 2023-09-22 DIAGNOSIS — M542 Cervicalgia: Secondary | ICD-10-CM | POA: Diagnosis present

## 2023-09-22 DIAGNOSIS — Z5321 Procedure and treatment not carried out due to patient leaving prior to being seen by health care provider: Secondary | ICD-10-CM | POA: Insufficient documentation

## 2023-09-22 DIAGNOSIS — R350 Frequency of micturition: Secondary | ICD-10-CM | POA: Diagnosis not present

## 2023-09-22 LAB — CBC WITH DIFFERENTIAL/PLATELET
Abs Immature Granulocytes: 0.03 K/uL (ref 0.00–0.07)
Basophils Absolute: 0 K/uL (ref 0.0–0.1)
Basophils Relative: 0 %
Eosinophils Absolute: 0.1 K/uL (ref 0.0–0.5)
Eosinophils Relative: 1 %
HCT: 45.7 % (ref 36.0–46.0)
Hemoglobin: 15.1 g/dL — ABNORMAL HIGH (ref 12.0–15.0)
Immature Granulocytes: 0 %
Lymphocytes Relative: 36 %
Lymphs Abs: 3.6 K/uL (ref 0.7–4.0)
MCH: 30.8 pg (ref 26.0–34.0)
MCHC: 33 g/dL (ref 30.0–36.0)
MCV: 93.3 fL (ref 80.0–100.0)
Monocytes Absolute: 0.6 K/uL (ref 0.1–1.0)
Monocytes Relative: 6 %
Neutro Abs: 5.7 K/uL (ref 1.7–7.7)
Neutrophils Relative %: 57 %
Platelets: 204 K/uL (ref 150–400)
RBC: 4.9 MIL/uL (ref 3.87–5.11)
RDW: 13.3 % (ref 11.5–15.5)
WBC: 10.1 K/uL (ref 4.0–10.5)
nRBC: 0 % (ref 0.0–0.2)

## 2023-09-22 LAB — I-STAT CHEM 8, ED
BUN: 11 mg/dL (ref 6–20)
Calcium, Ion: 1.1 mmol/L — ABNORMAL LOW (ref 1.15–1.40)
Chloride: 103 mmol/L (ref 98–111)
Creatinine, Ser: 0.6 mg/dL (ref 0.44–1.00)
Glucose, Bld: 229 mg/dL — ABNORMAL HIGH (ref 70–99)
HCT: 46 % (ref 36.0–46.0)
Hemoglobin: 15.6 g/dL — ABNORMAL HIGH (ref 12.0–15.0)
Potassium: 3.4 mmol/L — ABNORMAL LOW (ref 3.5–5.1)
Sodium: 137 mmol/L (ref 135–145)
TCO2: 23 mmol/L (ref 22–32)

## 2023-09-22 MED ORDER — HYDROCODONE-ACETAMINOPHEN 5-325 MG PO TABS
1.0000 | ORAL_TABLET | Freq: Once | ORAL | Status: AC
  Administered 2023-09-22: 1 via ORAL
  Filled 2023-09-22: qty 1

## 2023-09-22 MED ORDER — GADOBUTROL 1 MMOL/ML IV SOLN
8.0000 mL | Freq: Once | INTRAVENOUS | Status: AC | PRN
  Administered 2023-09-22: 8 mL via INTRAVENOUS

## 2023-09-22 MED ORDER — KETOROLAC TROMETHAMINE 30 MG/ML IJ SOLN
30.0000 mg | Freq: Once | INTRAMUSCULAR | Status: AC
  Administered 2023-09-22: 30 mg via INTRAMUSCULAR
  Filled 2023-09-22: qty 1

## 2023-09-22 NOTE — ED Triage Notes (Signed)
 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  Pt has worsening left sided numbness, weakness and neck pain.

## 2023-09-22 NOTE — ED Notes (Signed)
 Patient transported to MRI

## 2023-09-22 NOTE — ED Notes (Signed)
 Extra light green drawn and sent to main lab

## 2023-09-22 NOTE — ED Notes (Signed)
 Pt called for vitals re-check x2, no response.

## 2023-09-22 NOTE — ED Provider Triage Note (Signed)
 Emergency Medicine Provider Triage Evaluation Note  Elizabeth Steele , a 58 y.o. female  was evaluated in triage.  Pt complains of significant pain in the left side of her the neck that has been going down into her left arm for the last 3 weeks but starting yesterday she started having the same symptoms going down her left leg as well and reports it is very painful and uncomfortable.  She reports it goes all the way down the left side.  She also felt like she was a little off balance today but denies any sensation in her face.  She has had no speech or vision changes.  Had a CT done a week ago that showed arthritis and bulging disc.  She recently finished a steroid pack and has a neurosurgery appointment in October.  Also reports she has to go quickly to make it to the bathroom or else she will start urinating on herself which is new.  Review of Systems  Positive: Urinary frequency, left upper and lower extremity pain and tingling, balance feels off, neck pain Negative: Cough, congestion, vomiting, headache, fever  Physical Exam  BP (!) 142/94   Pulse 88   Temp 97.9 F (36.6 C)   Resp 14   Ht 5' 5 (1.651 m)   Wt 82.6 kg   LMP 11/18/2010   SpO2 99%   BMI 30.29 kg/m  Gen:   Awake, no distress   Resp:  Normal effort  MSK:   Moves extremities without difficulty, sensation is grossly intact.  No clumsiness with moving the upper and lower extremity.  Pulses intact in the upper and lower extremities.  Neck pain to the left side of the neck with palpation Other:  No abdominal pain  Medical Decision Making  Medically screening exam initiated at 4:31 PM.  Appropriate orders placed.  Dorothe ONEIDA Single was informed that the remainder of the evaluation will be completed by another provider, this initial triage assessment does not replace that evaluation, and the importance of remaining in the ED until their evaluation is complete.     Doretha Folks, MD 09/22/23 [phone]
